# Patient Record
Sex: Male | Born: 1965 | Hispanic: No | Marital: Married | Smoking: Never smoker
Health system: Southern US, Community
[De-identification: ages and names within clinical notes are randomized; demographics above are authoritative.]

## PROBLEM LIST (undated history)

## (undated) DIAGNOSIS — D649 Anemia, unspecified: Secondary | ICD-10-CM

## (undated) DIAGNOSIS — F419 Anxiety disorder, unspecified: Secondary | ICD-10-CM

## (undated) DIAGNOSIS — G629 Polyneuropathy, unspecified: Secondary | ICD-10-CM

## (undated) DIAGNOSIS — IMO0002 Reserved for concepts with insufficient information to code with codable children: Secondary | ICD-10-CM

## (undated) DIAGNOSIS — T7840XA Allergy, unspecified, initial encounter: Secondary | ICD-10-CM

## (undated) HISTORY — DX: Anxiety disorder, unspecified: F41.9

## (undated) HISTORY — DX: Reserved for concepts with insufficient information to code with codable children: IMO0002

## (undated) HISTORY — DX: Polyneuropathy, unspecified: G62.9

## (undated) HISTORY — DX: Anemia, unspecified: D64.9

## (undated) HISTORY — DX: Allergy, unspecified, initial encounter: T78.40XA

---

## 2001-08-19 ENCOUNTER — Emergency Department (HOSPITAL_COMMUNITY): Admission: EM | Admit: 2001-08-19 | Discharge: 2001-08-19 | Payer: Self-pay | Admitting: Emergency Medicine

## 2001-09-01 ENCOUNTER — Emergency Department (HOSPITAL_COMMUNITY): Admission: EM | Admit: 2001-09-01 | Discharge: 2001-09-01 | Payer: Self-pay

## 2011-11-04 ENCOUNTER — Ambulatory Visit (INDEPENDENT_AMBULATORY_CARE_PROVIDER_SITE_OTHER): Payer: 59 | Admitting: Family Medicine

## 2011-11-04 VITALS — BP 166/93 | HR 63 | Temp 98.0°F | Resp 16 | Ht 68.5 in | Wt 176.0 lb

## 2011-11-04 DIAGNOSIS — T169XXA Foreign body in ear, unspecified ear, initial encounter: Secondary | ICD-10-CM

## 2011-11-04 NOTE — Progress Notes (Signed)
  Subjective:    Patient ID: Greg Johnson, male    DOB: 1965/07/11, 46 y.o.   MRN: [id number]  HPI Greg Johnson is a 46 y.o. male Wearing ear plug - foam exterior of plug came apart and is in L ear.  Occurred this am around 8:45.  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  Was using different ear plugs than usual at work.  No prior ear problems.  No pain, just blocked.  No discharge.    Review of Systems As above.     Objective:   Physical Exam  Constitutional: He is oriented to person, place, and time. He appears well-developed and well-nourished.  HENT:  Head: Normocephalic and atraumatic.  Right Ear: Tympanic membrane, external ear and ear canal normal.  Left Ear: Tympanic membrane and ear canal normal.       Yellow foam fb at end of canal.  Removed without difficulty using alligator forceps.  No erythema or abrasion of canal.,  Tm pearly gray without any signs of rupture.   Pulmonary/Chest: Effort normal.  Neurological: He is alert and oriented to person, place, and time.  Skin: Skin is warm and dry.  Psychiatric: He has a normal mood and affect. His behavior is normal.        Assessment & Plan:  Greg Johnson is a 46 y.o. male 1. Acute foreign body of ear canal   L ear - removed without difficulty as above. Advised to change out earplugs and watch for decay in future ones.  rtc if any pain in ear, or residual sx's - understanding expressed.

## 2012-10-14 ENCOUNTER — Encounter (HOSPITAL_COMMUNITY): Payer: Self-pay

## 2012-10-14 ENCOUNTER — Emergency Department (HOSPITAL_COMMUNITY)
Admission: EM | Admit: 2012-10-14 | Discharge: 2012-10-14 | Disposition: A | Discharge status: Home / Self Care | Payer: 59 | Source: Home / Self Care

## 2012-10-14 DIAGNOSIS — S0501XA Injury of conjunctiva and corneal abrasion without foreign body, right eye, initial encounter: Secondary | ICD-10-CM

## 2012-10-14 DIAGNOSIS — S058X9A Other injuries of unspecified eye and orbit, initial encounter: Secondary | ICD-10-CM

## 2012-10-14 MED ORDER — TETRACAINE HCL 0.5 % OP SOLN
2.0000 [drp] | Freq: Once | OPHTHALMIC | Status: AC
  Administered 2012-10-14: 2 [drp] via OPHTHALMIC

## 2012-10-14 MED ORDER — POLYMYXIN B-TRIMETHOPRIM 10000-0.1 UNIT/ML-% OP SOLN: 1.0000 [drp] | OPHTHALMIC | Status: DC

## 2012-10-14 MED ORDER — TETRACAINE HCL 0.5 % OP SOLN
OPHTHALMIC | Status: AC
  Filled 2012-10-14: qty 2

## 2012-10-14 NOTE — ED Provider Notes (Signed)
  CSN: [id number]     Arrival date & time 10/14/12  1425 History     None    Chief Complaint  Patient presents with  . Eye Problem   (Consider location/radiation/quality/duration/timing/severity/associated sxs/prior Treatment) HPI Comments: 47 year old male states he has a foreign body sensation in the right since he was exposed to dust 2 days ago. Denies problems with vision. No pain just feels uncomfortable.   History reviewed. No pertinent past medical history. History reviewed. No pertinent past surgical history. History reviewed. No pertinent family history. History  Substance Use Topics  . Smoking status: Never Smoker   . Smokeless tobacco: Not on file  . Alcohol Use: No    Review of Systems  Constitutional: Negative.   Eyes: Positive for photophobia and redness. Negative for discharge, itching and visual disturbance.  All other systems reviewed and are negative.    Allergies  Aspirin  Home Medications   Current Outpatient Rx  Name  Route  Sig  Dispense  Refill  . trimethoprim-polymyxin b (POLYTRIM) ophthalmic solution   Right Eye   Place 1 drop into the right eye every 4 (four) hours. For 4 days   10 mL   0    BP 123/82  Pulse 72  Temp(Src) 98.2 F (36.8 C) (Oral)  Resp 16  SpO2 97% Physical Exam  Nursing note and vitals reviewed. Constitutional: He is oriented to person, place, and time. He appears well-developed and well-nourished. No distress.  Eyes: EOM are normal. Pupils are equal, round, and reactive to light.  Minor conjunctival and scleral injection. The eye was anesthetized with tetracaine and then used 4 seen stain. Magnification and blue light there was a pinpoint uptake over the cornea at 7:00 in various areas of uptake over the sclera. No hyphema, anterior chambers clear.  Neck: Normal range of motion. Neck supple.  Pulmonary/Chest: Effort normal.  Neurological: He is alert and oriented to person, place, and time. He exhibits normal muscle  tone.  Skin: Skin is warm and dry.    ED Course   Procedures (including critical care time)  Labs Reviewed - No data to display No results found. 1. Corneal abrasion, right, initial encounter     MDM  Rest for the remainder of the day and get a good night sleep. Polytrim eye drops one in the right eye 4 times a day for 4 days Do not rub I. If there is no improvement or any worsening such as pain, swelling, problem with vision call the above ophthalmologist for appointment.    Hayden Rasmussen, NP 10/14/12 1551

## 2012-10-14 NOTE — ED Provider Notes (Signed)
Medical screening examination/treatment/procedure(s) were performed by non-physician practitioner and as supervising physician I was immediately available for consultation/collaboration.  Leslee Home, M.D.   Reuben Likes, MD 10/14/12 [phone]

## 2012-10-14 NOTE — ED Notes (Signed)
States he was in a dusty place 2 days ago, and since then , he has had a FB type sensation both eyes

## 2013-06-02 ENCOUNTER — Ambulatory Visit (INDEPENDENT_AMBULATORY_CARE_PROVIDER_SITE_OTHER): Payer: 59 | Admitting: Family Medicine

## 2013-06-02 VITALS — BP 130/80 | HR 72 | Temp 97.6°F | Resp 16 | Ht 69.0 in | Wt 174.0 lb

## 2013-06-02 DIAGNOSIS — R32 Unspecified urinary incontinence: Secondary | ICD-10-CM

## 2013-06-02 DIAGNOSIS — M79672 Pain in left foot: Principal | ICD-10-CM

## 2013-06-02 DIAGNOSIS — M79671 Pain in right foot: Secondary | ICD-10-CM

## 2013-06-02 DIAGNOSIS — M79609 Pain in unspecified limb: Secondary | ICD-10-CM

## 2013-06-02 DIAGNOSIS — R350 Frequency of micturition: Secondary | ICD-10-CM

## 2013-06-02 LAB — POCT URINALYSIS DIPSTICK
BILIRUBIN UA: NEGATIVE
Blood, UA: NEGATIVE
GLUCOSE UA: NEGATIVE
Ketones, UA: NEGATIVE
LEUKOCYTES UA: NEGATIVE
NITRITE UA: NEGATIVE
Protein, UA: NEGATIVE
Spec Grav, UA: 1.015
UROBILINOGEN UA: 0.2
pH, UA: 6.5

## 2013-06-02 LAB — POCT UA - MICROSCOPIC ONLY
BACTERIA, U MICROSCOPIC: NEGATIVE
CASTS, UR, LPF, POC: NEGATIVE
Crystals, Ur, HPF, POC: NEGATIVE
Epithelial cells, urine per micros: NEGATIVE
MUCUS UA: NEGATIVE
RBC, urine, microscopic: NEGATIVE
WBC, UR, HPF, POC: NEGATIVE
YEAST UA: NEGATIVE

## 2013-06-02 LAB — GLUCOSE, POCT (MANUAL RESULT ENTRY): POC GLUCOSE: 91 mg/dL (ref 70–99)

## 2013-06-02 MED ORDER — MELOXICAM 7.5 MG PO TABS: ORAL_TABLET | ORAL | Status: DC

## 2013-06-02 NOTE — Progress Notes (Signed)
Subjective: Patient is here for several things. He has had problems over the last week with the bottom of his feet hurting him. Thank you this is gone some for several months, but especially over the last week it has been nonstop. When he is standing on it it just hurts clear across the soles of both feet. He is not diabetic. He is not had no injuries. He wears steel toed shoes. He says the pain is relieved when he gets off of them. He wears a double insert in his shoe.  He also has had some urinary incontinence issues a couple of times. He doesn't know why. He has no nocturia. No brain when he urinates. No history of kidney disease.  Objective: No acute distress. No CVA tenderness. Abdomen soft without mass or tenderness. Pulses are present in both feet. Feet are a little flat. No tenderness to palpation. Sensory with the filament was normal.  Assessment: Bilateral foot pain undetermined etiology History of urinary incontinence  Plan: Urinalysis and glucose Results for orders placed in visit on 06/02/13  POCT UA - MICROSCOPIC ONLY      Result Value Ref Range   WBC, Ur, HPF, POC neg     RBC, urine, microscopic neg     Bacteria, U Microscopic neg     Mucus, UA neg     Epithelial cells, urine per micros neg     Crystals, Ur, HPF, POC neg     Casts, Ur, LPF, POC neg     Yeast, UA neg    POCT URINALYSIS DIPSTICK      Result Value Ref Range   Color, UA light yellow     Clarity, UA clear     Glucose, UA neg     Bilirubin, UA neg     Ketones, UA neg     Spec Grav, UA 1.015     Blood, UA neg     pH, UA 6.5     Protein, UA neg     Urobilinogen, UA 0.2     Nitrite, UA neg     Leukocytes, UA Negative    GLUCOSE, POCT (MANUAL RESULT ENTRY)      Result Value Ref Range   POC Glucose 91  70 - 99 mg/dl   I think his foot pain is purely mechanical from being on his feet so much. He has a history of GERD and dyspepsia. However we decided to try a low dose of Mobic once daily and see if he  can tolerate it.  Urinary problems get worse we will refer him to a urologist.

## 2013-06-02 NOTE — Patient Instructions (Signed)
Your urine test is good. If you keep having problems with difficulty controlling urine we will consider referring him to a urologist  Not sure why you're having foot pain. I am going to try some meloxicam 7.5 mg once daily. Take it with your main meal. Drink some extra water when you take it. If it causes stomach pain please stop taking it. If it does well but you continue to have foot pain, you can try increasing it to 1 pill 2 times a day with food. If at any time it is causing her stomach to be worse please discontinue it.  Consider getting new inserts for your shoes. Old ones do lose their adding and don't work as well.

## 2013-07-10 ENCOUNTER — Telehealth: Payer: Self-pay

## 2013-07-10 NOTE — Telephone Encounter (Signed)
Recommend pt RTC to discuss other options/medications

## 2013-07-10 NOTE — Telephone Encounter (Signed)
Pt called. Says his feet are not any better. They are still inflamed. Wants to know what else he can do. Thanks

## 2013-07-11 NOTE — Telephone Encounter (Signed)
Left message on machine with instructions to return, call back if questions or concerns.

## 2013-09-04 ENCOUNTER — Telehealth: Payer: Self-pay

## 2013-09-04 NOTE — Telephone Encounter (Signed)
meloxicam (MOBIC) 7.5 MG tablet  [facility] [address]   Patient is difficult to understand, he is requesting a refill on mobic. States he called four times and no one responded.  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

## 2013-09-05 NOTE — Telephone Encounter (Signed)
Lm pt needs to RTC

## 2013-09-06 ENCOUNTER — Ambulatory Visit (INDEPENDENT_AMBULATORY_CARE_PROVIDER_SITE_OTHER): Payer: 59 | Admitting: Family Medicine

## 2013-09-06 VITALS — BP 126/80 | HR 69 | Temp 97.2°F | Resp 14 | Ht 69.0 in | Wt 169.0 lb

## 2013-09-06 DIAGNOSIS — M722 Plantar fascial fibromatosis: Secondary | ICD-10-CM

## 2013-09-06 MED ORDER — PREDNISONE 20 MG PO TABS: 20.0000 mg | ORAL_TABLET | Freq: Every day | ORAL | Status: DC

## 2013-09-06 NOTE — Patient Instructions (Signed)
Plantar Fasciitis  Plantar fasciitis is a common condition that causes foot pain. It is soreness (inflammation) of the band of tough fibrous tissue on the bottom of the foot that runs from the heel bone (calcaneus) to the ball of the foot. The cause of this soreness may be from excessive standing, poor fitting shoes, running on hard surfaces, being overweight, having an abnormal walk, or overuse (this is common in runners) of the painful foot or feet. It is also common in aerobic exercise dancers and ballet dancers.  SYMPTOMS   Most people with plantar fasciitis complain of:   Severe pain in the morning on the bottom of their foot especially when taking the first steps out of bed. This pain recedes after a few minutes of walking.   Severe pain is experienced also during walking following a long period of inactivity.   Pain is worse when walking barefoot or up stairs  DIAGNOSIS    Your caregiver will diagnose this condition by examining and feeling your foot.   Special tests such as X-rays of your foot, are usually not needed.  PREVENTION    Consult a sports medicine professional before beginning a new exercise program.   Walking programs offer a good workout. With walking there is a lower chance of overuse injuries common to runners. There is less impact and less jarring of the joints.   Begin all new exercise programs slowly. If problems or pain develop, decrease the amount of time or distance until you are at a comfortable level.   Wear good shoes and replace them regularly.   Stretch your foot and the heel cords at the back of the ankle (Achilles tendon) both before and after exercise.   Run or exercise on even surfaces that are not hard. For example, asphalt is better than pavement.   Do not run barefoot on hard surfaces.   If using a treadmill, vary the incline.   Do not continue to workout if you have foot or joint problems. Seek professional help if they do not improve.  HOME CARE INSTRUCTIONS     Avoid activities that cause you pain until you recover.   Use ice or cold packs on the problem or painful areas after working out.   Only take over-the-counter or prescription medicines for pain, discomfort, or fever as directed by your caregiver.   Soft shoe inserts or athletic shoes with air or gel sole cushions may be helpful.   If problems continue or become more severe, consult a sports medicine caregiver or your own health care provider. Cortisone is a potent anti-inflammatory medication that may be injected into the painful area. You can discuss this treatment with your caregiver.  MAKE SURE YOU:    Understand these instructions.   Will watch your condition.   Will get help right away if you are not doing well or get worse.  Document Released: 11/04/2000 Document Revised: 05/04/2011 Document Reviewed: 01/04/2008  ExitCare Patient Information 2015 [employer]. This information is not intended to replace advice given to you by your health care provider. Make sure you discuss any questions you have with your health care provider.

## 2013-09-06 NOTE — Progress Notes (Signed)
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  He has been having sore feet for several months, bilateral and symmetric.  No pain with movement, no deformity, no swelling, no trauma  He was seen several months ago and started on Mobic which did nothing for the soreness.  He also tried inserts into his steel toed shoes which has not helped either  Objective:  NAD Both feet appear normal in bony and soft tissue configurations.  There is mild tenderness particularly over the heels. Good pedal pulses, no rashes.  Assessment:  Mild plantar fasciitis.  Plantar fasciitis, bilateral - Plan: predniSONE (DELTASONE) 20 MG tablet  Signed, Elvina SidleKurt Taylour Lietzke, MD

## 2014-04-14 ENCOUNTER — Other Ambulatory Visit: Payer: Self-pay | Admitting: Family Medicine

## 2014-04-14 ENCOUNTER — Ambulatory Visit (INDEPENDENT_AMBULATORY_CARE_PROVIDER_SITE_OTHER): Payer: 59 | Admitting: Emergency Medicine

## 2014-04-14 VITALS — BP 140/88 | HR 75 | Temp 97.9°F | Resp 18 | Ht 69.0 in | Wt 172.0 lb

## 2014-04-14 DIAGNOSIS — G5791 Unspecified mononeuropathy of right lower limb: Secondary | ICD-10-CM

## 2014-04-14 DIAGNOSIS — R208 Other disturbances of skin sensation: Secondary | ICD-10-CM

## 2014-04-14 DIAGNOSIS — M79671 Pain in right foot: Secondary | ICD-10-CM

## 2014-04-14 DIAGNOSIS — G5793 Unspecified mononeuropathy of bilateral lower limbs: Secondary | ICD-10-CM

## 2014-04-14 DIAGNOSIS — E041 Nontoxic single thyroid nodule: Secondary | ICD-10-CM

## 2014-04-14 DIAGNOSIS — G5792 Unspecified mononeuropathy of left lower limb: Secondary | ICD-10-CM

## 2014-04-14 DIAGNOSIS — M79672 Pain in left foot: Principal | ICD-10-CM

## 2014-04-14 DIAGNOSIS — R200 Anesthesia of skin: Secondary | ICD-10-CM

## 2014-04-14 DIAGNOSIS — Z0000 Encounter for general adult medical examination without abnormal findings: Secondary | ICD-10-CM

## 2014-04-14 DIAGNOSIS — Z23 Encounter for immunization: Secondary | ICD-10-CM

## 2014-04-14 LAB — LIPID PANEL
CHOLESTEROL: 137 mg/dL (ref 0–200)
HDL: 46 mg/dL (ref >39)
LDL CALC: 80 mg/dL (ref 0–99)
Total CHOL/HDL Ratio: 3 Ratio
Triglycerides: 55 mg/dL (ref <150)
VLDL: 11 mg/dL (ref 0–40)

## 2014-04-14 LAB — POCT UA - MICROSCOPIC ONLY
CRYSTALS, UR, HPF, POC: NEGATIVE
Casts, Ur, LPF, POC: NEGATIVE
Mucus, UA: NEGATIVE
RBC, urine, microscopic: NEGATIVE
YEAST UA: NEGATIVE

## 2014-04-14 LAB — CBC
HEMATOCRIT: 43.3 % (ref 39.0–52.0)
Hemoglobin: 14.6 g/dL (ref 13.0–17.0)
MCH: 29.6 pg (ref 26.0–34.0)
MCHC: 33.7 g/dL (ref 30.0–36.0)
MCV: 87.8 fL (ref 78.0–100.0)
MPV: 10.3 fL (ref 8.6–12.4)
PLATELETS: 305 10*3/uL (ref 150–400)
RBC: 4.93 MIL/uL (ref 4.22–5.81)
RDW: 14.3 % (ref 11.5–15.5)
WBC: 4.3 10*3/uL (ref 4.0–10.5)

## 2014-04-14 LAB — RPR

## 2014-04-14 LAB — COMPLETE METABOLIC PANEL WITH GFR
ALBUMIN: 4.4 g/dL (ref 3.5–5.2)
ALK PHOS: 53 U/L (ref 39–117)
ALT: 21 U/L (ref 0–53)
AST: 19 U/L (ref 0–37)
BUN: 14 mg/dL (ref 6–23)
CO2: 29 meq/L (ref 19–32)
CREATININE: 1.01 mg/dL (ref 0.50–1.35)
Calcium: 9.2 mg/dL (ref 8.4–10.5)
Chloride: 103 mEq/L (ref 96–112)
GFR, Est African American: >89 mL/min
GFR, Est Non African American: 87 mL/min
GLUCOSE: 100 mg/dL — AB (ref 70–99)
Potassium: 4.6 mEq/L (ref 3.5–5.3)
SODIUM: 139 meq/L (ref 135–145)
Total Bilirubin: 0.8 mg/dL (ref 0.2–1.2)
Total Protein: 7.1 g/dL (ref 6.0–8.3)

## 2014-04-14 LAB — POCT URINALYSIS DIPSTICK
Bilirubin, UA: NEGATIVE
Blood, UA: NEGATIVE
GLUCOSE UA: NEGATIVE
Ketones, UA: NEGATIVE
LEUKOCYTES UA: NEGATIVE
NITRITE UA: NEGATIVE
PROTEIN UA: NEGATIVE
SPEC GRAV UA: 1.02
Urobilinogen, UA: 0.2
pH, UA: 7

## 2014-04-14 LAB — VITAMIN B12: Vitamin B-12: 682 pg/mL (ref 211–911)

## 2014-04-14 LAB — IFOBT (OCCULT BLOOD): IFOBT: NEGATIVE

## 2014-04-14 LAB — TSH: TSH: 1.539 u[IU]/mL (ref 0.350–4.500)

## 2014-04-14 LAB — HIV ANTIBODY (ROUTINE TESTING W REFLEX): HIV: NONREACTIVE

## 2014-04-14 MED ORDER — TRIAMCINOLONE 0.1 % CREAM:EUCERIN CREAM 1:1: 1.0000 "application " | TOPICAL_CREAM | Freq: Three times a day (TID) | CUTANEOUS | Status: DC

## 2014-04-14 NOTE — Addendum Note (Signed)
Addended by: Eddie CandleLUCK, Nitin Mckowen L on: 04/14/2014 11:07 AM   Modules accepted: Orders

## 2014-04-14 NOTE — Addendum Note (Signed)
Addended by: Eddie CandleLUCK, Oshea Percival L on: 04/14/2014 11:00 AM   Modules accepted: Orders

## 2014-04-14 NOTE — Patient Instructions (Signed)

## 2014-04-14 NOTE — Progress Notes (Addendum)
Subjective:  This chart was scribed for Earl Lites, MD by Haywood Pao, ED Scribe at [facility].The patient was seen in exam room 11 and the patient's care was started at 8:45 AM.   Patient ID: Greg Johnson, male    DOB: 1965/08/24, 49 y.o.   MRN: [id number] Chief Complaint  Patient presents with  . Annual Exam    pt NPO,  last PE over 1 year ago  . Foot Pain    c/o burning - has been checked here previously   HPI HPI Comments: Greg Johnson is a 49 y.o. male who presents to [facility] for an annual physical exam. His last physical exam was last year. He is also complaining of bilateral foot pain. He was seen last year for the same complaint and diagnosed with plantar fasciitis. He was treated with prednisone which did not improve his symptoms. Pt works as an Hotel manager, standing for long hours. He is originally from [place], he went back home in September and had all his immunizations prior to leaving.  Past Medical History  Diagnosis Date  . Allergy   . Anemia   . Anxiety   . Ulcer    Allergies  Allergen Reactions  . Aspirin Nausea And Vomiting   Prior to Admission medications   Not on File   Review of Systems  Constitutional: Positive for diaphoresis.  Musculoskeletal: Positive for back pain.  Neurological: Positive for light-headedness.  All other systems reviewed and are negative.     Objective:  Physical Exam  Constitutional: He is oriented to person, place, and time. He appears well-developed and well-nourished. No distress.  HENT:  Head: Normocephalic and atraumatic.  Eyes: Pupils are equal, round, and reactive to light.  Neck: Normal range of motion.  prominence of the right lobe of the thyroid.  Cardiovascular: Normal rate and regular rhythm.   Pulmonary/Chest: Effort normal. No respiratory distress.  Musculoskeletal: Normal range of motion.  Neurological: He is alert and oriented to person, place, and time.  Decreased reflex of both  legs but normal strength.  Skin: Skin is warm and dry.  Scarring of his fast and left anterior chest.  Psychiatric: He has a normal mood and affect. His behavior is normal.  Nursing note and vitals reviewed. BP 140/88 mmHg  Pulse 75  Temp(Src) 97.9 F (36.6 C) (Oral)  Resp 18  Ht  (1.753 m)  Wt 172 lb (78.019 kg)  BMI 25.39 kg/m2  SpO2 100% Results for orders placed or performed in visit on 04/14/14  POCT urinalysis dipstick  Result Value Ref Range   Color, UA yellow    Clarity, UA clear    Glucose, UA neg    Bilirubin, UA neg    Ketones, UA neg    Spec Grav, UA 1.020    Blood, UA neg    pH, UA 7.0    Protein, UA neg    Urobilinogen, UA 0.2    Nitrite, UA neg    Leukocytes, UA Negative   POCT UA - Microscopic Only  Result Value Ref Range   WBC, Ur, HPF, POC 0-1    RBC, urine, microscopic neg    Bacteria, U Microscopic trace    Mucus, UA neg    Epithelial cells, urine per micros 0-1    Crystals, Ur, HPF, POC neg    Casts, Ur, LPF, POC neg    Yeast, UA neg    hemosure negative    Assessment &  Plan:  Patient has a peripheral neuropathy. Routine tests done for this. He has a thyroid nodule on exam. All labs were done. We'll proceed with a thyroid ultrasound. T gap given today. He does have some decreased sensation in his feet I wonder if he does have some type of neuropathy. I did check a B12 level and a HIV test.I personally performed the services described in this documentation, which was scribed in my presence. The recorded information has been reviewed and is accurate.

## 2014-04-16 ENCOUNTER — Telehealth: Payer: Self-pay | Admitting: *Deleted

## 2014-04-16 LAB — PSA: PSA: 2.33 ng/mL (ref <=4.00)

## 2014-04-16 NOTE — Telephone Encounter (Signed)
Pt called stating someone called and left an message.  However, there was no telephone message in epic.  Pt states for us to call him tomorrow please.

## 2014-04-16 NOTE — Telephone Encounter (Signed)
Dr Cleta Albertsaub, do you want to OK RFs on mobic Rxd for pt in the past for his foot pain?

## 2014-04-17 MED ORDER — MELOXICAM 7.5 MG PO TABS: ORAL_TABLET | ORAL | Status: DC

## 2014-04-17 NOTE — Telephone Encounter (Addendum)
Pt CB and reported that he never has had any trouble w/his stomach when taking meloxicam, only w/aspirin. I advised that I will send in a RF, but that if he continues to have problems he should RTC to see if he needs to see a specialist or other treatment. Advised pt to take meloxicam w/food. Pt agreed.

## 2014-04-17 NOTE — Telephone Encounter (Signed)
Call patient and be sure he does not have any nausea or vomiting or GI upset when he takes the meloxicam. He has a history of sensitivity to aspirin. If he tolerates the meloxicam okay then go ahead and call in a prescription.

## 2014-04-17 NOTE — Telephone Encounter (Signed)
Called cell # in EPIC and father answered and gave me pt's number [phone]. I called and LMOM for pt to CB. I also LM at the H # listed in EPIC, but not sure if it is correct.

## 2014-04-17 NOTE — Addendum Note (Signed)
Addended by: Sheppard PlumberBRIGGS, Anessia Oakland A on: 04/17/2014 12:28 PM   Modules accepted: Orders

## 2014-05-04 ENCOUNTER — Ambulatory Visit
Admission: RE | Admit: 2014-05-04 | Discharge: 2014-05-04 | Disposition: A | Discharge status: Home / Self Care | Payer: 59 | Source: Ambulatory Visit | Attending: Emergency Medicine | Admitting: Emergency Medicine

## 2014-05-04 DIAGNOSIS — E041 Nontoxic single thyroid nodule: Secondary | ICD-10-CM

## 2014-05-04 DIAGNOSIS — Z0000 Encounter for general adult medical examination without abnormal findings: Secondary | ICD-10-CM

## 2014-05-04 NOTE — Telephone Encounter (Signed)
Patient states the Mobic is not working. He would like a different medication. He says the bottom of his foot has a burning sensation.

## 2014-05-05 ENCOUNTER — Other Ambulatory Visit: Payer: Self-pay | Admitting: Family Medicine

## 2014-05-05 ENCOUNTER — Other Ambulatory Visit: Payer: Self-pay | Admitting: Emergency Medicine

## 2014-05-05 DIAGNOSIS — E042 Nontoxic multinodular goiter: Secondary | ICD-10-CM

## 2014-05-05 MED ORDER — GABAPENTIN 300 MG PO CAPS: ORAL_CAPSULE | ORAL | Status: DC

## 2014-06-06 ENCOUNTER — Ambulatory Visit (INDEPENDENT_AMBULATORY_CARE_PROVIDER_SITE_OTHER): Payer: 59 | Admitting: Physician Assistant

## 2014-06-06 VITALS — BP 124/82 | HR 85 | Temp 97.6°F | Resp 18 | Ht 69.0 in | Wt 169.0 lb

## 2014-06-06 DIAGNOSIS — J309 Allergic rhinitis, unspecified: Secondary | ICD-10-CM

## 2014-06-06 DIAGNOSIS — R0981 Nasal congestion: Secondary | ICD-10-CM | POA: Diagnosis not present

## 2014-06-06 DIAGNOSIS — R52 Pain, unspecified: Secondary | ICD-10-CM

## 2014-06-06 DIAGNOSIS — R059 Cough, unspecified: Secondary | ICD-10-CM

## 2014-06-06 DIAGNOSIS — R05 Cough: Secondary | ICD-10-CM

## 2014-06-06 LAB — POCT INFLUENZA A/B
INFLUENZA A, POC: NEGATIVE
Influenza B, POC: NEGATIVE

## 2014-06-06 MED ORDER — HYDROCODONE-HOMATROPINE 5-1.5 MG/5ML PO SYRP: 5.0000 mL | ORAL_SOLUTION | Freq: Three times a day (TID) | ORAL | Status: DC | PRN

## 2014-06-06 NOTE — Patient Instructions (Addendum)
Your flu swab was negative today.  For your allergies, please take zyrtec every day and use a steroid nasal spray like flonase every day for the rest of allergy season. This should help with your congestion. Taking a daily decongestant like sudafed or zyrtec-d may also help.  For the cough, please take the hycodan up to every 8 hours as needed for cough. This may make you sleepy. Getting plenty of rest, drinking lots of fluids may help.  Please come back to see us if you're not feeling better in 4-5 days.

## 2014-06-06 NOTE — Progress Notes (Signed)
Subjective:    Patient ID: Greg Johnson, male    DOB: [birth year], 49 y.o.   MRN: [id number]  Chief Complaint  Patient presents with  . Fever    x3 weeks but worse now   . Headache  . Cough    some mucous in the morning   . Nasal Congestion  . Fatigue  . Generalized Body Aches   Patient Active Problem List   Diagnosis Date Noted  . Neuropathy of both feet 04/14/2014  . Thyroid nodule 04/14/2014   Prior to Admission medications   Medication Sig Start Date End Date Taking? Authorizing Provider  Triamcinolone Acetonide (TRIAMCINOLONE 0.1 % CREAM : EUCERIN) CREA Apply 1 application topically 3 (three) times daily. Patient not taking: Reported on 06/06/2014 04/14/14   Collene Gobble, MD   Medications, allergies, past medical history, surgical history, family history, social history and problem list reviewed and updated.  HPI  60 yom with no pertinent pmh presents with above sx.   Sx have been intermittent for past few wks. Subjective fevers, head/nasal congestion, generalized body aches and mildly prod cough. Sx last for few days at time then resolve for few days. Taking dayquil and nyquil with some relief. Feels that sx came on again yest with subj fevers and body aches. Pt has not checked temp at home.   Denies cp, sob, st, abd pain, n/v, diarrhea. Vitals reassuring today, no antipyretics this am.   Gets these sx every year this time of year. Admits to seasonal allergies. Takes zyrtec rarely.   Review of Systems See HPI.     Objective:   Physical Exam  Constitutional: He appears well-developed and well-nourished.  Non-toxic appearance. He does not have a sickly appearance. He does not appear ill. No distress.  BP 124/82 mmHg  Pulse 85  Temp(Src) 97.6 F (36.4 C) (Oral)  Resp 18  Ht  (1.753 m)  Wt 169 lb (76.658 kg)  BMI 24.95 kg/m2  SpO2 98%   HENT:  Right Ear: Tympanic membrane normal.  Left Ear: Tympanic membrane normal.  Nose: Mucosal edema and rhinorrhea  present. Right sinus exhibits no maxillary sinus tenderness and no frontal sinus tenderness. Left sinus exhibits no maxillary sinus tenderness and no frontal sinus tenderness.  Mouth/Throat: Uvula is midline, oropharynx is clear and moist and mucous membranes are normal. No oropharyngeal exudate, posterior oropharyngeal edema, posterior oropharyngeal erythema or tonsillar abscesses.  Pulmonary/Chest: Effort normal and breath sounds normal. He has no decreased breath sounds. He has no wheezes. He has no rhonchi. He has no rales.  Lymphadenopathy:       Head (right side): No submental, no submandibular and no tonsillar adenopathy present.       Head (left side): No submental, no submandibular and no tonsillar adenopathy present.    He has no cervical adenopathy.   Results for orders placed or performed in visit on 06/06/14  POCT Influenza A/B  Result Value Ref Range   Influenza A, POC Negative    Influenza B, POC Negative       Assessment & Plan:   81 yom with no pertinent pmh presents with above sx.   Cough - Plan: POCT Influenza A/B Body aches - Plan: POCT Influenza A/B Head congestion - Plan: POCT Influenza A/B Allergic rhinitis, unspecified allergic rhinitis type --flu swab negative --vitals normal, physical exam not suggestive of bacterial etiology --sx likely due to allergic rhinitis or viral uri --> zyrtec-d, flonase, hycodan, rest, fluids, tylenol prn --rtc  4-5 days if not improving  Donnajean Lopesodd M. Kynzleigh Bandel, PA-C Physician Assistant-Certified [facility]  06/06/2014 1:06 PM

## 2014-10-19 ENCOUNTER — Ambulatory Visit (INDEPENDENT_AMBULATORY_CARE_PROVIDER_SITE_OTHER): Payer: Commercial Managed Care - PPO | Admitting: Physician Assistant

## 2014-10-19 VITALS — BP 150/90 | HR 86 | Temp 98.0°F | Ht 68.5 in | Wt 177.0 lb

## 2014-10-19 DIAGNOSIS — L239 Allergic contact dermatitis, unspecified cause: Secondary | ICD-10-CM

## 2014-10-19 DIAGNOSIS — L200 Besnier's prurigo: Secondary | ICD-10-CM

## 2014-10-19 DIAGNOSIS — R21 Rash and other nonspecific skin eruption: Secondary | ICD-10-CM

## 2014-10-19 MED ORDER — TRIAMCINOLONE ACETONIDE 0.1 % EX CREA: 1.0000 "application " | TOPICAL_CREAM | Freq: Two times a day (BID) | CUTANEOUS | Status: DC

## 2014-10-19 NOTE — Patient Instructions (Signed)
As I'm unsure what's causing your irritation at this time I've referred you to an allergist to determine the cause.  We'll let you know when this appointment is scheduled.  Please apply the steroid topical to the irritated areas twice daily. Please apply eucerin cream 1-2 times daily over your body, especially after showering and bathing.  Taking a benadryl at night can help with the itching to help you sleep.

## 2014-10-19 NOTE — Progress Notes (Signed)
   Subjective:    Patient ID: Greg Johnson, male    DOB: September 04, 1965, 49 y.o.   MRN: [id number]  Chief Complaint  Patient presents with  . Medication Problem    pt states since 05/2014 visit has had "red lines" all over his body - given Zyrtec & Tusinex for cough-he feels allergic to meds  . Rash   Medications, allergies, past medical history, surgical history, family history, social history and problem list reviewed and updated.  HPI  48 yom presents to clinic worried about allergic rxn.   Has felt itching all over body intermittently for past 2 months, mostly bilateral arms and some facial involvement. Approx 1-2 x week. Cannot think of any aggravating activities other than occasionally hot showers are irritating. Denies any obvious rash. Only new medication was cough medicine and zyrtec which he took 4 months for about one week. Otherwise no new exposures including lotions, detergents, bedding, pets, foods, meds. Did start new job few months ago where he is possibly exposed to new chemicals though he is not specifically aware of any.   Denies fevers, chills. Nobody else in household experiencing this. Denies trouble breathing or swallowing. He is concerned he is allergic to the meds he took 4 months ago.    Review of Systems No cp, sob, abd pain, n/v, diarrhea.     Objective:   Physical Exam  Constitutional: He is oriented to person, place, and time. He appears well-developed and well-nourished.  Non-toxic appearance. He does not have a sickly appearance. He does not appear ill. No distress.  BP 150/90 mmHg  Pulse 86  Temp(Src) 98 F (36.7 C) (Oral)  Ht 5' 8.5" (1.74 m)  Wt 177 lb (80.287 kg)  BMI 26.52 kg/m2  SpO2 98%   HENT:  Mouth/Throat: Uvula is midline, oropharynx is clear and moist and mucous membranes are normal.  Pulmonary/Chest: Effort normal and breath sounds normal.  Neurological: He is alert and oriented to person, place, and time.  Skin:  No obvious rash,  irritation across bilateral arms from recent scratching. No signs infx. Diffuse mildly dry skin with some flaking.       Assessment & Plan:   Rash and nonspecific skin eruption  Allergic dermatitis - Plan: triamcinolone cream (KENALOG) 0.1 %, Ambulatory referral to Allergy --unsure of etiology, doubt medications from 4 months ago are cause --triamcinolone topical bid, eucerin bid especially after showering as dry skin could be cause of pruritis --benadryl qhs prn  --referral to allergy as pt very concerned about possible allergens --rtc if no relief with these measures for more broad pruritis workup  Donnajean Lopes, PA-C Physician Assistant-Certified [facility]  10/20/2014 10:09 PM

## 2014-10-20 ENCOUNTER — Encounter: Payer: Self-pay | Admitting: Physician Assistant

## 2014-12-22 ENCOUNTER — Ambulatory Visit (INDEPENDENT_AMBULATORY_CARE_PROVIDER_SITE_OTHER): Payer: Commercial Managed Care - PPO | Admitting: Physician Assistant

## 2014-12-22 VITALS — BP 130/70 | HR 79 | Temp 97.6°F | Resp 18 | Ht 68.5 in | Wt 177.0 lb

## 2014-12-22 DIAGNOSIS — M545 Low back pain, unspecified: Secondary | ICD-10-CM

## 2014-12-22 DIAGNOSIS — M79672 Pain in left foot: Secondary | ICD-10-CM | POA: Diagnosis not present

## 2014-12-22 DIAGNOSIS — M79671 Pain in right foot: Secondary | ICD-10-CM | POA: Diagnosis not present

## 2014-12-22 MED ORDER — CYCLOBENZAPRINE HCL 5 MG PO TABS: 5.0000 mg | ORAL_TABLET | Freq: Three times a day (TID) | ORAL | Status: DC | PRN

## 2014-12-22 NOTE — Progress Notes (Signed)
   Subjective:    Patient ID: Greg Johnson, male    DOB: 07/01/1965, 49 y.o.   MRN: [id number]  Chief Complaint  Patient presents with  . Back Pain  . Foot Pain    bilateral   Medications, allergies, past medical history, surgical history, family history, social history and problem list reviewed and updated.  HPI  89 yom presents with above complaints.   Bilateral foot pain - we have seen him for this multiple times in clinic. His pain is along with soles of both feet. Fairly constant throughout the day, has been present for more than one year. The pain is actually a burning sensation. No similar sensation over dorsum of feet or up legs. 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 The pain is not worst when first stepping out of bed in morning. Denies fevers or chills. From our clinic we have tried mobic, prednisone, and shoe implants without relief. He has purchased several pair of new shoes without relief.   Back pain - Also chronic. Bilateral low back pain. No trauma. Achy throughout the day. No relieving factors. Denies bowel or bladder dysfunction. Denies fevers. No sciatica or radiation of pain.   Review of Systems See HPI     Objective:   Physical Exam  Constitutional: He is oriented to person, place, and time. He appears well-developed and well-nourished.  Non-toxic appearance. He does not have a sickly appearance. He does not appear ill. No distress.  BP 130/70 mmHg  Pulse 79  Temp(Src) 97.6 F (36.4 C) (Oral)  Resp 18  Ht 5' 8.5" (1.74 m)  Wt 177 lb (80.287 kg)  BMI 26.52 kg/m2  SpO2 99%   Musculoskeletal:       Cervical back: Normal.       Thoracic back: Normal.       Lumbar back: He exhibits tenderness and spasm. He exhibits normal range of motion, no bony tenderness and no deformity.       Back:       Right foot: There is normal range of motion, no tenderness and no bony tenderness.       Left foot: There is normal range of  motion, no tenderness and no bony tenderness.  Bilateral spasms along lumbar paraspinals. Negative straight leg raise bilaterally.   Feet - Negative squeeze test bilaterally. No point tenderness with toe extension and plantar surface palpation. Normal range of motion without issue.   Neurological: He is alert and oriented to person, place, and time.  Psychiatric: He has a normal mood and affect. His speech is normal and behavior is normal.      Assessment & Plan:   Bilateral foot pain - Plan: Ambulatory referral to Podiatry --as we have treated with several different measures we will refer to podiatry at this time --doubt plantar fascitis as previous measures have not worked, pain is not worst 1st thing in morning and he is not point tender on exam --consider Neurontin for nerve type pain pending podiatry recommendation  Bilateral low back pain without sciatica - Plan: cyclobenzaprine (FLEXERIL) 5 MG tablet --no spinal ttp --normal range of motion, normal exam other than ttp over bilateral paraspinal spasms --heat, light range of motion, flexeril  Donnajean Lopesodd M. Jailen Lung, PA-C Physician Assistant-Certified [facility]  12/22/2014 2:23 PM

## 2014-12-22 NOTE — Patient Instructions (Signed)
For your bilateral foot pain I've referred you to the local podiatry group.  I think this is the best option to get their input before we start a nerve pain medication.  We'll be in touch with you when this appointment is scheduled.  For the low back pain, you have small spasms along your spinal muscles.  Applying heat to the area along with light massage will help.  Taking the flexeril every 8 hours as needed will help to ease the spasm. Keep in mind this medicine can make you drowsy so please use with caution.

## 2015-01-01 ENCOUNTER — Other Ambulatory Visit: Payer: Self-pay | Admitting: Physician Assistant

## 2015-01-07 ENCOUNTER — Encounter: Payer: Self-pay | Admitting: Podiatry

## 2015-01-07 ENCOUNTER — Ambulatory Visit (INDEPENDENT_AMBULATORY_CARE_PROVIDER_SITE_OTHER): Payer: Commercial Managed Care - PPO | Admitting: Podiatry

## 2015-01-07 ENCOUNTER — Ambulatory Visit (INDEPENDENT_AMBULATORY_CARE_PROVIDER_SITE_OTHER): Payer: Commercial Managed Care - PPO

## 2015-01-07 VITALS — BP 145/93 | HR 77 | Resp 16 | Ht 68.5 in | Wt 177.0 lb

## 2015-01-07 DIAGNOSIS — M79673 Pain in unspecified foot: Secondary | ICD-10-CM

## 2015-01-07 DIAGNOSIS — M779 Enthesopathy, unspecified: Secondary | ICD-10-CM

## 2015-01-07 DIAGNOSIS — G629 Polyneuropathy, unspecified: Secondary | ICD-10-CM | POA: Diagnosis not present

## 2015-01-07 MED ORDER — DICLOFENAC SODIUM 75 MG PO TBEC: 75.0000 mg | DELAYED_RELEASE_TABLET | Freq: Two times a day (BID) | ORAL | Status: DC

## 2015-01-07 MED ORDER — GABAPENTIN 400 MG PO CAPS: 400.0000 mg | ORAL_CAPSULE | Freq: Three times a day (TID) | ORAL | Status: DC

## 2015-01-07 NOTE — Progress Notes (Signed)
Subjective:     Patient ID: Greg Johnson, male   DOB: 03/27/1965, 49 y.o.   MRN: [id number]  HPI patient presents stating I been getting burning in both my feet that I wanted to get checked and it's been present for about a year and a half and I've seen other physicians and I was on medicine but I did not take it as I should have.   Review of Systems  All other systems reviewed and are negative.      Objective:   Physical Exam  Constitutional: He is oriented to person, place, and time.  Cardiovascular: Intact distal pulses.   Musculoskeletal: Normal range of motion.  Neurological: He is oriented to person, place, and time.  Skin: Skin is warm and dry.  Nursing note and vitals reviewed.  neuro status was intact with both sharp dull and vibratory and negative Tinel's sign noted. Patient has good vascular supply DP and PT pulses and moderate flatfoot deformity noted bilateral. Patient has good digital perfusion is well oriented 3 and has diffuse discomfort but no specific area that's painful when palpated     Assessment:     Appears to be him form of neuropathic like condition or also possibly related to foot structure    Plan:     H&P conditions reviewed and at this time I reviewed medication. We are going to place him on gabapentin 400 mg that he will take 3 times a day whether he has numbness or not or whether it seems to be helping or not. I also placed him on Voltaren 75 mg twice a day and dispensed ankle brace is to lift the arch is up

## 2015-01-07 NOTE — Progress Notes (Signed)
   Subjective:    Patient ID: Greg Johnson, male    DOB: 05/12/1965, 49 y.o.   MRN: [id number]  HPI Patient presents with bilateral foot pain, bottom of feet. Pt stated, "burning sensation on entire bottom of both feet". This has been going on for the past 1.5 years.   Review of Systems  Constitutional: Positive for fatigue.  All other systems reviewed and are negative.      Objective:   Physical Exam        Assessment & Plan:

## 2015-01-29 ENCOUNTER — Other Ambulatory Visit: Payer: Self-pay | Admitting: Podiatry

## 2015-01-29 NOTE — Telephone Encounter (Signed)
Pt needs an appt prior to refills. 

## 2015-02-06 ENCOUNTER — Encounter: Payer: Self-pay | Admitting: Podiatry

## 2015-02-06 ENCOUNTER — Ambulatory Visit (INDEPENDENT_AMBULATORY_CARE_PROVIDER_SITE_OTHER): Payer: Commercial Managed Care - PPO | Admitting: Podiatry

## 2015-02-06 VITALS — BP 131/80 | HR 73 | Resp 16

## 2015-02-06 DIAGNOSIS — G629 Polyneuropathy, unspecified: Secondary | ICD-10-CM

## 2015-02-06 DIAGNOSIS — M79673 Pain in unspecified foot: Secondary | ICD-10-CM

## 2015-02-08 ENCOUNTER — Ambulatory Visit: Payer: Commercial Managed Care - PPO | Admitting: Podiatry

## 2015-02-08 NOTE — Progress Notes (Signed)
Subjective:     Patient ID: Greg Johnson, male   DOB: 03/02/1965, 49 y.o.   MRN: [id number]  HPI patient states he still having pain and it doesn't seem the medicine has helped   Review of Systems     Objective:   Physical Exam No change neurovascular status with moderate numbing-like sensations bilateral with occasional shooting and tingling pain    Assessment:     Probable neuropathy-like symptomatology    Plan:     Going to start this patient on oral vitamin therapy and continuation of gabapentin and will have to see a neurologist if symptoms were to progress

## 2015-07-10 ENCOUNTER — Telehealth: Payer: Self-pay | Admitting: Emergency Medicine

## 2015-07-10 NOTE — Telephone Encounter (Signed)
Please call patient and see if he followed up on his thyroid nodule. If he did not we can reschedule him for evaluation.

## 2015-07-10 NOTE — Telephone Encounter (Signed)
Spoke with pt, he states he will come in Saturday to discuss. Communication barrier.

## 2015-07-10 NOTE — Telephone Encounter (Signed)
Left message for pt to call back  °

## 2015-07-12 ENCOUNTER — Telehealth: Payer: Self-pay

## 2015-07-12 DIAGNOSIS — E041 Nontoxic single thyroid nodule: Secondary | ICD-10-CM

## 2015-07-12 NOTE — Telephone Encounter (Signed)
Dr. Cleta Albertsaub, I do not see this?

## 2015-07-12 NOTE — Telephone Encounter (Signed)
Pt says he did go for thyroid check last year and if he needs a new one this year please go ahead schedule this.  [phone] - see previous message

## 2015-07-13 NOTE — Telephone Encounter (Signed)
He may have the x-ray done the lung. Please go ahead and schedule a repeat thyroid ultrasound to evaluate thyromegaly and nodule

## 2015-07-15 NOTE — Telephone Encounter (Signed)
Referral placed.

## 2015-07-19 ENCOUNTER — Ambulatory Visit
Admission: RE | Admit: 2015-07-19 | Discharge: 2015-07-19 | Disposition: A | Discharge status: Home / Self Care | Payer: Commercial Managed Care - PPO | Source: Ambulatory Visit | Attending: Emergency Medicine | Admitting: Emergency Medicine

## 2015-07-19 DIAGNOSIS — E041 Nontoxic single thyroid nodule: Secondary | ICD-10-CM

## 2015-07-21 ENCOUNTER — Other Ambulatory Visit: Payer: Self-pay | Admitting: Emergency Medicine

## 2015-07-21 DIAGNOSIS — E042 Nontoxic multinodular goiter: Secondary | ICD-10-CM

## 2015-08-28 ENCOUNTER — Encounter: Payer: Self-pay | Admitting: Endocrinology

## 2015-08-28 ENCOUNTER — Ambulatory Visit (INDEPENDENT_AMBULATORY_CARE_PROVIDER_SITE_OTHER): Payer: Commercial Managed Care - PPO | Admitting: Endocrinology

## 2015-08-28 VITALS — BP 124/80 | HR 105 | Ht 68.5 in | Wt 174.0 lb

## 2015-08-28 DIAGNOSIS — G609 Hereditary and idiopathic neuropathy, unspecified: Secondary | ICD-10-CM

## 2015-08-28 DIAGNOSIS — E042 Nontoxic multinodular goiter: Secondary | ICD-10-CM

## 2015-08-28 LAB — TSH: TSH: 1.05 u[IU]/mL (ref 0.35–4.50)

## 2015-08-28 LAB — HEMOGLOBIN A1C: HEMOGLOBIN A1C: 6.1 % (ref 4.6–6.5)

## 2015-08-28 LAB — T4, FREE: FREE T4: 0.85 ng/dL (ref 0.60–1.60)

## 2015-08-28 LAB — GLUCOSE, RANDOM: Glucose, Bld: 106 mg/dL — ABNORMAL HIGH (ref 70–99)

## 2015-08-28 NOTE — Progress Notes (Addendum)
Patient ID: Greg Johnson, male   DOB: 10/20/1965, 50 y.o.   MRN: [id number]           Reason for Appointment: Goiter, new consultation    History of Present Illness:   The patient's thyroid enlargement was first discovered in 2008 approximately Not clear what the initial evaluation was but no records are available The patient himself has not felt his thyroid enlargement  He has not had difficulty with swallowing  Does not feel like he has any choking sensation or pressure in the neck area No recent weight change  Lab Results  Component Value Date   FREET4 0.85 08/28/2015   TSH 1.05 08/28/2015   TSH 1.539 04/14/2014    He had ultrasound exams in 2016 showing multinodular goiter Repeat thyroid ultrasound in 5/17 showed increase in the size of the dominant nodules as follows:  Right thyroid lobe Measurements: 7.1 x 3.3 x 4.1 cm. Solitary 4.2 x 2.6 x 5.6 cm mostly solid nodule, mid/inferior lobe (previously 53 x 31 x 38).  Left thyroid lobe  Measurements: 6.4 x 2 x 2.1 cm. Several nodules. Largest 2.1 x 1.4 x 2.7 cm, solid, inferior pole (Previously 24 x 17 x 18).     Medication List    Notice  As of 08/28/2015  9:15 PM   You have not been prescribed any medications.      Allergies:  Allergies  Allergen Reactions  . Aspirin Nausea And Vomiting    Past Medical History  Diagnosis Date  . Allergy   . Anemia   . Anxiety   . Ulcer     History reviewed. No pertinent past surgical history.  Family History  Problem Relation Age of Onset  . Diabetes Neg Hx      Social History:  reports that he has never smoked. He has never used smokeless tobacco. He reports that he does not drink alcohol or use illicit drugs.      Review of Systems  Constitutional: Negative for weight loss and weight gain.  HENT: Negative for trouble swallowing.   Respiratory: Negative for shortness of breath.   Endocrine: Negative for fatigue and cold intolerance.  Neurological:   He complains of burning on the feet especially on the bottom for the last 3 years.  Has not been benefited by use of gabapentin currently and has been followed by podiatrist      Examination:   BP 124/80 mmHg  Pulse 105  Ht 5' 8.5" (1.74 m)  Wt 174 lb (78.926 kg)  BMI 26.07 kg/m2  SpO2 96%   General Appearance: pleasant, averagely built and nourished          Eyes: No abnormal prominence or eyelid swelling.          Neck: The thyroid is enlarged bilaterally but mostly on the right side . Neck circumference is  39 cm over the thyroid.  Right thyroid lobe measures about 4 cm, relatively soft and smooth Left lobe was just palpable on swallowing  There is no stridor. Pemberton sign is negative There is no lymphadenopathy.     Cardiovascular: Normal  heart sounds, no murmur Respiratory:  Lungs clear Neurological: REFLEXES: at biceps are normal.  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  We'll check A1c and glucose   Greg Johnson 08/28/2015   Note: This office note was prepared with Dragon voice recognition system technology. Any transcriptional errors that result from this process are unintentional.

## 2015-08-30 ENCOUNTER — Telehealth: Payer: Self-pay

## 2015-08-30 ENCOUNTER — Telehealth: Payer: Self-pay | Admitting: Endocrinology

## 2015-08-30 NOTE — Telephone Encounter (Signed)
Called patient to give lab results. No answer, left voicemail to call back with results.

## 2015-08-30 NOTE — Telephone Encounter (Signed)
Patient returned phone call. Gave him lab results. Patient has appointment in 6 months. Will follow up with him then. No other questions or concerns.

## 2015-08-30 NOTE — Telephone Encounter (Signed)
-----   Message from Greg LittlerAjay Kumar, MD sent at 08/28/2015  9:23 PM EDT ----- Thyroid level is normal.  Diabetes test is borderline although glucose today is not high, needs periodic follow-up

## 2015-08-30 NOTE — Telephone Encounter (Signed)
Patient is returning your call.  

## 2015-08-30 NOTE — Telephone Encounter (Signed)
Tried to return patient phone call. No answer.

## 2015-09-03 NOTE — Telephone Encounter (Signed)
Please advise, Thanks!  

## 2015-09-03 NOTE — Telephone Encounter (Signed)
It was done in May and they can see it in his record anyway

## 2015-09-03 NOTE — Telephone Encounter (Signed)
Victorino DikeJennifer from [facility] ask if patient has already had a ultra sound, if not he need one before he has the biopsy. Please advise [phone]

## 2015-09-04 ENCOUNTER — Other Ambulatory Visit: Payer: Self-pay | Admitting: Endocrinology

## 2015-09-04 DIAGNOSIS — E042 Nontoxic multinodular goiter: Secondary | ICD-10-CM

## 2015-09-04 NOTE — Telephone Encounter (Signed)
I contacted Victorino DikeJennifer with [facility] and left a vm advising US was done on 07/19/2015. Requested a call back if the she would like to discuss.

## 2015-09-17 ENCOUNTER — Ambulatory Visit (INDEPENDENT_AMBULATORY_CARE_PROVIDER_SITE_OTHER): Payer: Commercial Managed Care - PPO | Admitting: Physician Assistant

## 2015-09-17 VITALS — BP 132/80 | HR 76 | Temp 98.2°F | Resp 17 | Ht 69.5 in | Wt 175.0 lb

## 2015-09-17 DIAGNOSIS — L989 Disorder of the skin and subcutaneous tissue, unspecified: Secondary | ICD-10-CM

## 2015-09-17 NOTE — Patient Instructions (Addendum)
     IF you received an x-ray today, you will receive an invoice from [facility]. Please contact [facility] at [phone] with questions or concerns regarding your invoice.   IF you received labwork today, you will receive an invoice from [employer]. Please contact [facility] at [phone] with questions or concerns regarding your invoice.   Our billing staff will not be able to assist you with questions regarding bills from these companies.  You will be contacted with the lab results as soon as they are available. The fastest way to get your results is to activate your My Chart account. Instructions are located on the last page of this paperwork. If you have not heard from [place] regarding the results in 2 weeks, please contact this office.    Please await contact for referral to specialist.   We will also go ahead and do ultrasound while we are waiting to see what this is.

## 2015-09-17 NOTE — Progress Notes (Signed)
[facility] [address] [place] [address] [phone]  Date:  09/17/2015   Name:  Greg Johnson   DOB:  April 30, 1965   MRN:  [id number]  PCP:  Lonia Blood, MD    History of Present Illness:  Greg Johnson is a 50 y.o. male patient who presents to [facility] for cc of bump in head. --3 weeks ago, no trauma.  Little pustule he reports that has grown since.  There is no pain.  No pruritic.  No headache.  No dizziness.    Patient Active Problem List   Diagnosis Date Noted  . Neuropathy of both feet (HCC) 04/14/2014  . Thyroid nodule 04/14/2014    Past Medical History:  Diagnosis Date  . Allergy   . Anemia   . Anxiety   . Ulcer     No past surgical history on file.  Social History  Substance Use Topics  . Smoking status: Never Smoker  . Smokeless tobacco: Never Used  . Alcohol use No    Family History  Problem Relation Age of Onset  . Diabetes Neg Hx     Allergies  Allergen Reactions  . Aspirin Nausea And Vomiting    Medication list has been reviewed and updated.  No current outpatient prescriptions on file prior to visit.   No current facility-administered medications on file prior to visit.     ROS ROS otherwise unremarkable unless listed above.   Physical Examination: BP 132/80 (BP Location: Right Arm, Patient Position: Sitting, Cuff Size: Normal)   Pulse 76   Temp 98.2 F (36.8 C) (Oral)   Resp 17   Ht 5' 9.5" (1.765 m)   Wt 175 lb (79.4 kg)   SpO2 98%   BMI 25.47 kg/m  Ideal Body Weight: Weight in (lb) to have BMI = 25: 171.4  Physical Exam  Constitutional: He is oriented to person, place, and time. He appears well-developed and well-nourished. No distress.  HENT:  Head: Normocephalic and atraumatic.  Center of forehead fatty like growth, mobile and non-tender (possibility that this is fluctuant vs fatty).  No erythema, warmth, or change in skin texture.  Eyes: Conjunctivae and EOM are normal. Pupils are equal, round,  and reactive to light.  Cardiovascular: Normal rate.   Pulmonary/Chest: Effort normal. No respiratory distress.  Neurological: He is alert and oriented to person, place, and time.  Skin: Skin is warm and dry. He is not diaphoretic.  Psychiatric: He has a normal mood and affect. His behavior is normal.     Assessment and Plan: Greg Johnson is a 50 y.o. male who is here today for swelling at forehead. Lipoma, vs. Cyst. General surgery consult appreciated at this time. Benign skin lesion of forehead - Plan: US Soft Tissue Head/Neck, Ambulatory referral to General Surgery   Trena Platt, PA-C [facility] 09/17/2015 4:32 PM

## 2015-09-21 ENCOUNTER — Ambulatory Visit (INDEPENDENT_AMBULATORY_CARE_PROVIDER_SITE_OTHER): Payer: Commercial Managed Care - PPO | Admitting: Family Medicine

## 2015-09-21 VITALS — BP 130/72 | HR 74 | Temp 98.5°F | Resp 16 | Ht 69.0 in | Wt 175.0 lb

## 2015-09-21 DIAGNOSIS — M5442 Lumbago with sciatica, left side: Secondary | ICD-10-CM | POA: Diagnosis not present

## 2015-09-21 DIAGNOSIS — Z13220 Encounter for screening for lipoid disorders: Secondary | ICD-10-CM

## 2015-09-21 DIAGNOSIS — G629 Polyneuropathy, unspecified: Secondary | ICD-10-CM | POA: Diagnosis not present

## 2015-09-21 DIAGNOSIS — Z1211 Encounter for screening for malignant neoplasm of colon: Secondary | ICD-10-CM

## 2015-09-21 DIAGNOSIS — R030 Elevated blood-pressure reading, without diagnosis of hypertension: Secondary | ICD-10-CM | POA: Diagnosis not present

## 2015-09-21 DIAGNOSIS — IMO0001 Reserved for inherently not codable concepts without codable children: Secondary | ICD-10-CM

## 2015-09-21 DIAGNOSIS — D649 Anemia, unspecified: Secondary | ICD-10-CM

## 2015-09-21 DIAGNOSIS — Z136 Encounter for screening for cardiovascular disorders: Secondary | ICD-10-CM

## 2015-09-21 DIAGNOSIS — M5441 Lumbago with sciatica, right side: Secondary | ICD-10-CM

## 2015-09-21 DIAGNOSIS — Z0000 Encounter for general adult medical examination without abnormal findings: Secondary | ICD-10-CM

## 2015-09-21 LAB — LIPID PANEL
CHOL/HDL RATIO: 3 ratio (ref <=5.0)
Cholesterol: 136 mg/dL (ref 125–200)
HDL: 45 mg/dL (ref >=40)
LDL CALC: 80 mg/dL (ref <130)
TRIGLYCERIDES: 56 mg/dL (ref <150)
VLDL: 11 mg/dL (ref <30)

## 2015-09-21 LAB — CBC WITH DIFFERENTIAL/PLATELET
BASOS ABS: 54 {cells}/uL (ref 0–200)
Basophils Relative: 1 %
EOS PCT: 3 %
Eosinophils Absolute: 162 cells/uL (ref 15–500)
HEMATOCRIT: 44.1 % (ref 38.5–50.0)
HEMOGLOBIN: 14.6 g/dL (ref 13.2–17.1)
LYMPHS ABS: 2052 {cells}/uL (ref 850–3900)
LYMPHS PCT: 38 %
MCH: 29.6 pg (ref 27.0–33.0)
MCHC: 33.1 g/dL (ref 32.0–36.0)
MCV: 89.5 fL (ref 80.0–100.0)
MPV: 10.3 fL (ref 7.5–12.5)
Monocytes Absolute: 378 cells/uL (ref 200–950)
Monocytes Relative: 7 %
NEUTROS PCT: 51 %
Neutro Abs: 2754 cells/uL (ref 1500–7800)
Platelets: 269 10*3/uL (ref 140–400)
RBC: 4.93 MIL/uL (ref 4.20–5.80)
RDW: 14.3 % (ref 11.0–15.0)
WBC: 5.4 10*3/uL (ref 3.8–10.8)

## 2015-09-21 LAB — COMPLETE METABOLIC PANEL WITH GFR
ALT: 9 U/L (ref 9–46)
AST: 14 U/L (ref 10–35)
Albumin: 4.4 g/dL (ref 3.6–5.1)
Alkaline Phosphatase: 41 U/L (ref 40–115)
BUN: 21 mg/dL (ref 7–25)
CHLORIDE: 105 mmol/L (ref 98–110)
CO2: 25 mmol/L (ref 20–31)
Calcium: 9.4 mg/dL (ref 8.6–10.3)
Creat: 1.06 mg/dL (ref 0.70–1.33)
GFR, EST NON AFRICAN AMERICAN: 81 mL/min (ref >=60)
GLUCOSE: 93 mg/dL (ref 65–99)
POTASSIUM: 4.2 mmol/L (ref 3.5–5.3)
Sodium: 139 mmol/L (ref 135–146)
Total Bilirubin: 0.7 mg/dL (ref 0.2–1.2)
Total Protein: 6.6 g/dL (ref 6.1–8.1)

## 2015-09-21 LAB — VITAMIN B12: Vitamin B-12: 721 pg/mL (ref 200–1100)

## 2015-09-21 LAB — IFOBT (OCCULT BLOOD): IFOBT: NEGATIVE

## 2015-09-21 MED ORDER — GABAPENTIN 300 MG PO CAPS: 300.0000 mg | ORAL_CAPSULE | Freq: Three times a day (TID) | ORAL | 3 refills | Status: DC

## 2015-09-21 NOTE — Progress Notes (Signed)
Patient ID: Greg Johnson, male    DOB: Feb 23, 1966, 50 y.o.   MRN: [id number]  PCP: Lonia Blood, MD  Chief Complaint  Patient presents with  . Annual Exam    Subjective:   HPI Presents for a complete physical exam.  He reports overall good health. 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  He was recently seen by endocrinologist, Dr. Reather Littler for evaluation of thyroid nodules. He was last seen by endo on 08/28/15 and glucose and thyroid panel were normal.  He continues to experience neuropathy of both feet with worsening symptoms as night.  He has also seen a podiatrist without resolution of symptoms.  He tried and failed treatment with Gabapentin 300 mg nightly, but states he is open  trying the medication again at a higher dose. Denies hearing or vision changes. Last vision exam 2 years ago. Denies shortness of breath, chest pain, or palpitations. Denies urinary frequency, retention, or burning. Denies abdominal pain, indigestion, nausea or diarrhea.   Complains of a two month history of localized in mid-lower back pain.  Worst with standing at work. Relieved with rest.    Review of Systems  Constitutional: Negative.   HENT: Negative.   Eyes: Negative.   Respiratory: Negative.   Cardiovascular: Negative.   Gastrointestinal: Negative.   Endocrine: Negative.   Genitourinary: Negative.  Negative for decreased urine volume, penile swelling, scrotal swelling, testicular pain and urgency.  Musculoskeletal: Positive for back pain.       See HPI  Skin: Negative.   Allergic/Immunologic: Negative.   Neurological: Negative.   Hematological: Negative.   Psychiatric/Behavioral: Negative.        Patient Active Problem List   Diagnosis Date Noted  . Neuropathy of both feet (HCC) 04/14/2014  . Thyroid nodule 04/14/2014     Prior to Admission medications   Not on File     Allergies  Allergen Reactions  .  Aspirin Nausea And Vomiting       Objective:  Physical Exam  Constitutional: He is oriented to person, place, and time. He appears well-developed and well-nourished.  HENT:  Head: Normocephalic and atraumatic.  Eyes: Conjunctivae are normal. Pupils are equal, round, and reactive to light.  Neck: Normal range of motion. Neck supple.  Pulmonary/Chest: Effort normal and breath sounds normal.  Abdominal: Soft. Bowel sounds are normal.  Genitourinary: Rectum normal, prostate normal and penis normal. Rectal exam shows guaiac negative stool. No penile tenderness.  Musculoskeletal: Normal range of motion. He exhibits no tenderness.  Neurological: He is alert and oriented to person, place, and time. He has normal reflexes.  Skin: Skin is warm and dry.  Psychiatric: He has a normal mood and affect. His behavior is normal. Judgment and thought content normal.  Nursing note and vitals reviewed.          Assessment & Plan:  .1. Annual physical exam - IFOBT POC -negative  Age-appropriate anticipatory guidance provided  2. Encounter for lipid screening for cardiovascular disease -Lipid panel-controlled  Recheck lipid panel in 12 months  3. Anemia, unspecified anemia type - CBC with Differential-unremarkable No evidence of anemia   4. Neuropathy (HCC) - Vitamin B12-results pending  Gabapentin 300 mg, TID as needed.   5. Elevated blood pressure - COMPLETE METABOLIC PANEL WITH GFR -stable, controlled with diet and exercise Periodically check your blood pressure and keep a log of readings.  6. Bilateral low back pain with sciatica, sciatica  laterality unspecified Use proper body mechanics when lifting Avoid prolong standing.  Make frequent positional changes. Gabapentin 300 mg, TID as needed.  7. Special screening for malignant neoplasms, colon - Cologuard If normal, will need to be repeated within 3 years.   Will follow-up with you regarding lab results.  Godfrey Pick.  Tiburcio Pea, MSN, FNP-C [facility]

## 2015-09-21 NOTE — Patient Instructions (Addendum)
Will follow-up with lab results.  Blood pressure was sloghtly elevated today Please periodically check pressures and keep and log of readings. Return in 2 months to have blood pressure re-evaluated. Bring readings with you to visit.   Hypertension Hypertension, commonly called high blood pressure, is when the force of blood pumping through your arteries is too strong. Your arteries are the blood vessels that carry blood from your heart throughout your body. A blood pressure reading consists of a higher number over a lower number, such as 110/72. The higher number (systolic) is the pressure inside your arteries when your heart pumps. The lower number (diastolic) is the pressure inside your arteries when your heart relaxes. Ideally you want your blood pressure below 120/80. Hypertension forces your heart to work harder to pump blood. Your arteries may become narrow or stiff. Having untreated or uncontrolled hypertension can cause heart attack, stroke, kidney disease, and other problems. RISK FACTORS Some risk factors for high blood pressure are controllable. Others are not.  Risk factors you cannot control include:   Race. You may be at higher risk if you are African American.  Age. Risk increases with age.  Gender. Men are at higher risk than women before age 109 years. After age 33, women are at higher risk than men. Risk factors you can control include:  Not getting enough exercise or physical activity.  Being overweight.  Getting too much fat, sugar, calories, or salt in your diet.  Drinking too much alcohol. SIGNS AND SYMPTOMS Hypertension does not usually cause signs or symptoms. Extremely high blood pressure (hypertensive crisis) may cause headache, anxiety, shortness of breath, and nosebleed. DIAGNOSIS To check if you have hypertension, your health care provider will measure your blood pressure while you are seated, with your arm held at the level of your heart. It should be  measured at least twice using the same arm. Certain conditions can cause a difference in blood pressure between your right and left arms. A blood pressure reading that is higher than normal on one occasion does not mean that you need treatment. If it is not clear whether you have high blood pressure, you may be asked to return on a different day to have your blood pressure checked again. Or, you may be asked to monitor your blood pressure at home for 1 or more weeks. TREATMENT Treating high blood pressure includes making lifestyle changes and possibly taking medicine. Living a healthy lifestyle can help lower high blood pressure. You may need to change some of your habits. Lifestyle changes may include:  Following the DASH diet. This diet is high in fruits, vegetables, and whole grains. It is low in salt, red meat, and added sugars.  Keep your sodium intake below 2,300 mg per day.  Getting at least 30-45 minutes of aerobic exercise at least 4 times per week.  Losing weight if necessary.  Not smoking.  Limiting alcoholic beverages.  Learning ways to reduce stress. Your health care provider may prescribe medicine if lifestyle changes are not enough to get your blood pressure under control, and if one of the following is true:  You are 41-83 years of age and your systolic blood pressure is above 140.  You are 15 years of age or older, and your systolic blood pressure is above 150.  Your diastolic blood pressure is above 90.  You have diabetes, and your systolic blood pressure is over 140 or your diastolic blood pressure is over 90.  You have kidney disease  and your blood pressure is above 140/90.  You have heart disease and your blood pressure is above 140/90. Your personal target blood pressure may vary depending on your medical conditions, your age, and other factors. HOME CARE INSTRUCTIONS  Have your blood pressure rechecked as directed by your health care provider.   Take  medicines only as directed by your health care provider. Follow the directions carefully. Blood pressure medicines must be taken as prescribed. The medicine does not work as well when you skip doses. Skipping doses also puts you at risk for problems.  Do not smoke.   Monitor your blood pressure at home as directed by your health care provider. SEEK MEDICAL CARE IF:   You think you are having a reaction to medicines taken.  You have recurrent headaches or feel dizzy.  You have swelling in your ankles.  You have trouble with your vision. SEEK IMMEDIATE MEDICAL CARE IF:  You develop a severe headache or confusion.  You have unusual weakness, numbness, or feel faint.  You have severe chest or abdominal pain.  You vomit repeatedly.  You have trouble breathing. MAKE SURE YOU:   Understand these instructions.  Will watch your condition.  Will get help right away if you are not doing well or get worse.   This information is not intended to replace advice given to you by your health care provider. Make sure you discuss any questions you have with your health care provider.   Document Released: 02/09/2005 Document Revised: 06/26/2014 Document Reviewed: 12/02/2012 Elsevier Interactive Patient Education 2016 Elsevier Inc.     Neuropathic Pain Neuropathic pain is pain caused by damage to the nerves that are responsible for certain sensations in your body (sensory nerves). The pain can be caused by damage to:   The sensory nerves that send signals to your spinal cord and brain (peripheral nervous system).  The sensory nerves in your brain or spinal cord (central nervous system). Neuropathic pain can make you more sensitive to pain. What would be a minor sensation for most people may feel very painful if you have neuropathic pain. This is usually a long-term condition that can be difficult to treat. The type of pain can differ from person to person. It may start suddenly (acute),  or it may develop slowly and last for a long time (chronic). Neuropathic pain may come and go as damaged nerves heal or may stay at the same level for years. It often causes emotional distress, loss of sleep, and a lower quality of life. CAUSES  The most common cause of damage to a sensory nerve is diabetes. Many other diseases and conditions can also cause neuropathic pain. Causes of neuropathic pain can be classified as:  Toxic. Many drugs and chemicals can cause toxic damage. The most common cause of toxic neuropathic pain is damage from drug treatment for cancer (chemotherapy).  Metabolic. This type of pain can happen when a disease causes imbalances that damage nerves. Diabetes is the most common of these diseases. Vitamin B deficiency caused by long-term alcohol abuse is another common cause.  Traumatic. Any injury that cuts, crushes, or stretches a nerve can cause damage and pain. A common example is feeling pain after losing an arm or leg (phantom limb pain).  Compression-related. If a sensory nerve gets trapped or compressed for a long period of time, the blood supply to the nerve can be cut off.  Vascular. Many blood vessel diseases can cause neuropathic pain by decreasing blood supply  and oxygen to nerves.  Autoimmune. This type of pain results from diseases in which the body's defense system mistakenly attacks sensory nerves. Examples of autoimmune diseases that can cause neuropathic pain include lupus and multiple sclerosis.  Infectious. Many types of viral infections can damage sensory nerves and cause pain. Shingles infection is a common cause of this type of pain.  Inherited. Neuropathic pain can be a symptom of many diseases that are passed down through families (genetic). SIGNS AND SYMPTOMS  The main symptom is pain. Neuropathic pain is often described as:  Burning.  Shock-like.  Stinging.  Hot or cold.  Itching. DIAGNOSIS  No single test can diagnose neuropathic  pain. Your health care provider will do a physical exam and ask you about your pain. You may use a pain scale to describe how bad your pain is. You may also have tests to see if you have a high sensitivity to pain and to help find the cause and location of any sensory nerve damage. These tests may include:  Imaging studies, such as:  X-rays.  CT scan.  MRI.  Nerve conduction studies to test how well nerve signals travel through your sensory nerves (electrodiagnostic testing).  Stimulating your sensory nerves through electrodes on your skin and measuring the response in your spinal cord and brain (somatosensory evoked potentials). TREATMENT  Treatment for neuropathic pain may change over time. You may need to try different treatment options or a combination of treatments. Some options include:  Over-the-counter pain relievers.  Prescription medicines. Some medicines used to treat other conditions may also help neuropathic pain. These include medicines to:  Control seizures (anticonvulsants).  Relieve depression (antidepressants).  Prescription-strength pain relievers (narcotics). These are usually used when other pain relievers do not help.  Transcutaneous nerve stimulation (TENS). This uses electrical currents to block painful nerve signals. The treatment is painless.  Topical and local anesthetics. These are medicines that numb the nerves. They can be injected as a nerve block or applied to the skin.  Alternative treatments, such as:  Acupuncture.  Meditation.  Massage.  Physical therapy.  Pain management programs.  Counseling. HOME CARE INSTRUCTIONS  Learn as much as you can about your condition.  Take medicines only as directed by your health care provider.  Work closely with all your health care providers to find what works best for you.  Have a good support system at home.  Consider joining a chronic pain support group. SEEK MEDICAL CARE IF:  Your pain  treatments are not helping.  You are having side effects from your medicines.  You are struggling with fatigue, mood changes, depression, or anxiety.   This information is not intended to replace advice given to you by your health care provider. Make sure you discuss any questions you have with your health care provider.   Document Released: 11/07/2003 Document Revised: 03/02/2014 Document Reviewed: 07/20/2013 Elsevier Interactive Patient Education 2016 [employer].    IF you received an x-ray today, you will receive an invoice from [facility]. Please contact [facility] at [phone] with questions or concerns regarding your invoice.   IF you received labwork today, you will receive an invoice from [employer]. Please contact [facility] at [phone] with questions or concerns regarding your invoice.   Our billing staff will not be able to assist you with questions regarding bills from these companies.  You will be contacted with the lab results as soon as they are available. The fastest way to get  your results is to activate your My Chart account. Instructions are located on the last page of this paperwork. If you have not heard from [place] regarding the results in 2 weeks, please contact this office.

## 2015-09-22 ENCOUNTER — Encounter: Payer: Self-pay | Admitting: Family Medicine

## 2015-09-23 ENCOUNTER — Encounter: Payer: Self-pay | Admitting: Family Medicine

## 2015-09-25 ENCOUNTER — Telehealth: Payer: Self-pay | Admitting: *Deleted

## 2015-09-25 ENCOUNTER — Telehealth: Payer: Self-pay | Admitting: Family Medicine

## 2015-09-25 NOTE — Telephone Encounter (Signed)
Pt calliing about lab results.

## 2015-09-25 NOTE — Telephone Encounter (Signed)
Returned patients call advising of letter sent with information pertaining to lab results.

## 2015-09-27 ENCOUNTER — Encounter: Payer: Self-pay | Admitting: Physician Assistant

## 2015-09-30 ENCOUNTER — Telehealth: Payer: Self-pay

## 2015-09-30 NOTE — Telephone Encounter (Signed)
Routed to labs pool.

## 2015-09-30 NOTE — Telephone Encounter (Signed)
Patient called as asked to speak to Joaquin CourtsKimberly Harris about lab results. I informed patient that labs will give him a call once reviewed. Please call!  [phone]

## 2015-10-23 IMAGING — US US SOFT TISSUE HEAD/NECK
1 series · 14 of 25 positions shown · non-contrast
Comparison: None.

CLINICAL DATA: 49-year-old male with a history of thyroid nodules

EXAM:
THYROID ULTRASOUND
TECHNIQUE: Ultrasound examination of the thyroid gland and adjacent soft
tissues was performed.

[Series 1: us soft tissue head/neck · 0.08mm/px · 14 of 45 slices shown]
[im 1/45]
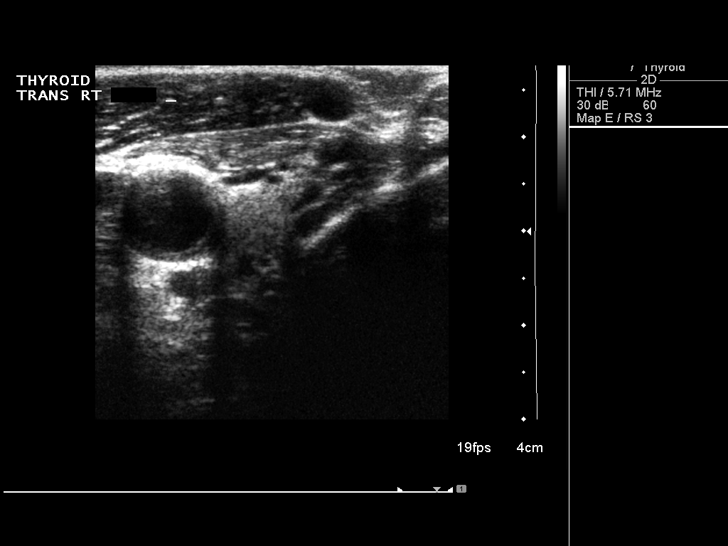
[im 4/45]
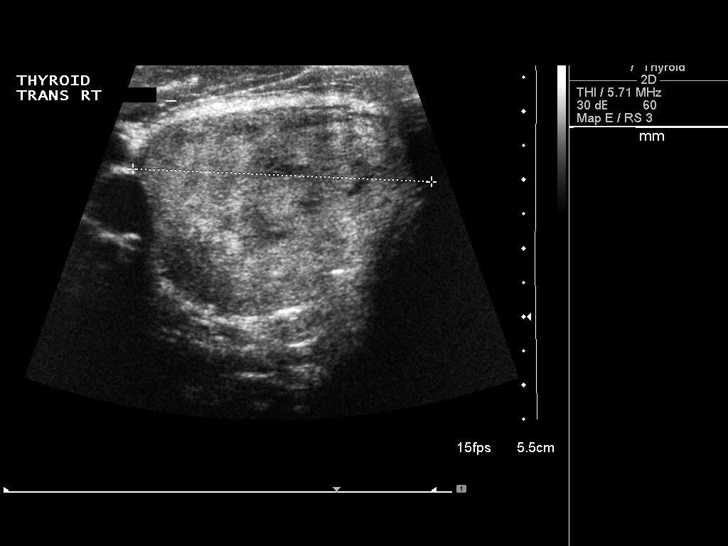
[im 8/45]
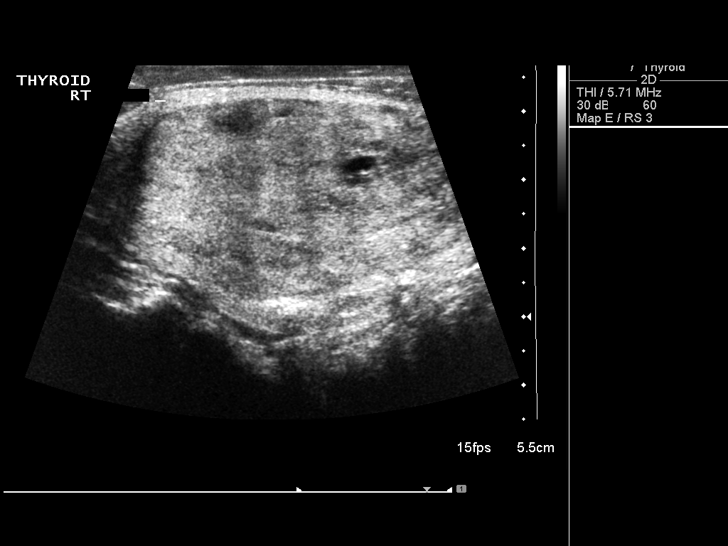
[im 12/45]
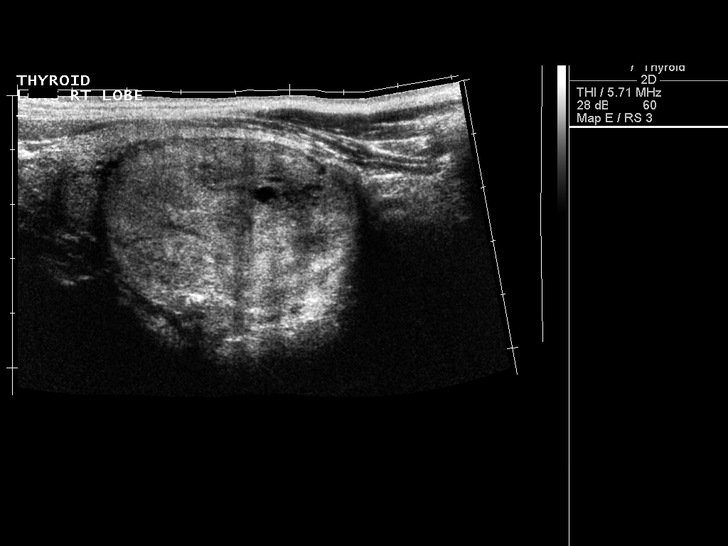
[im 15/45]
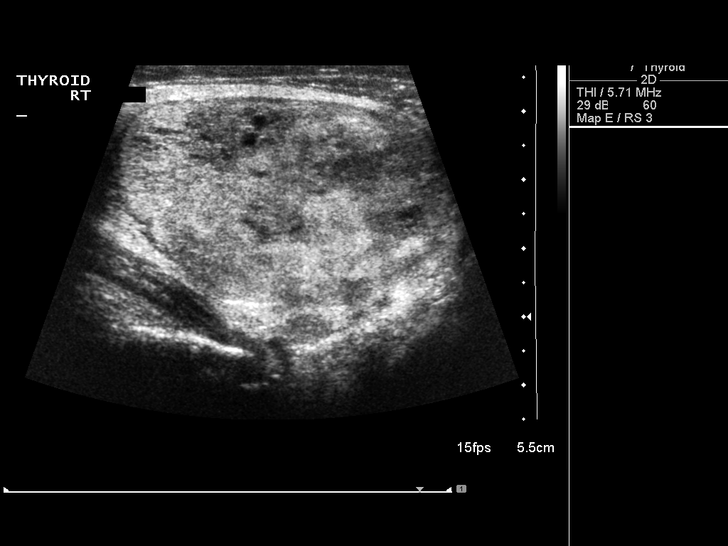
[im 17/45]
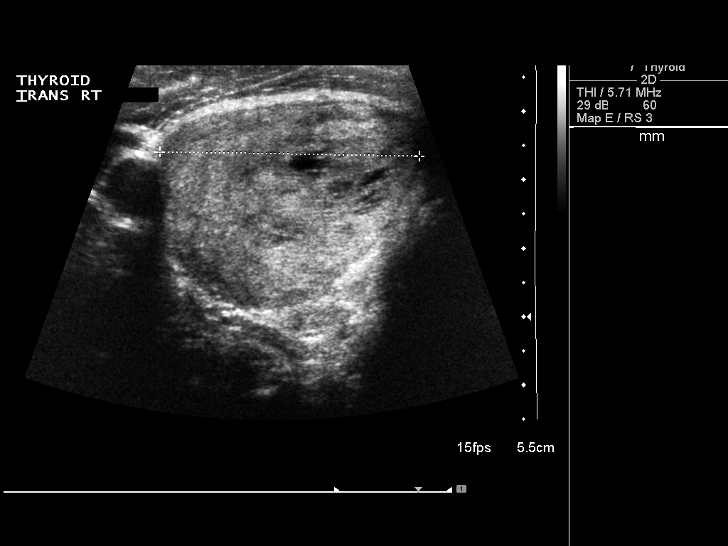
[im 21/45]
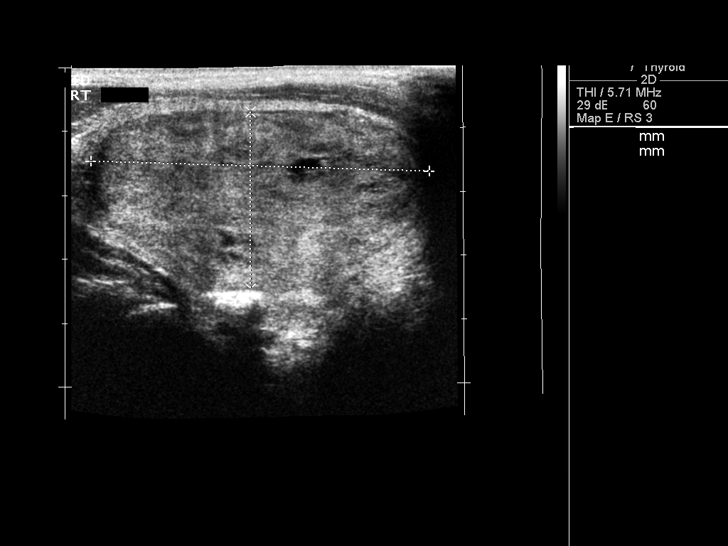
[im 24/45]
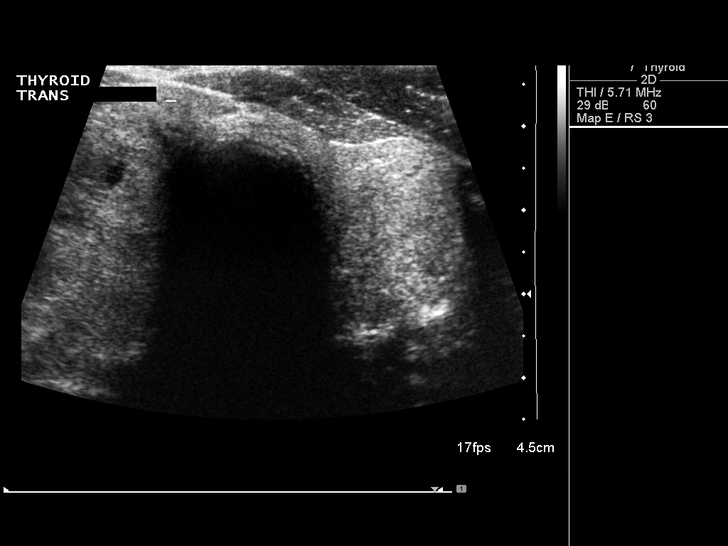
[im 28/45]
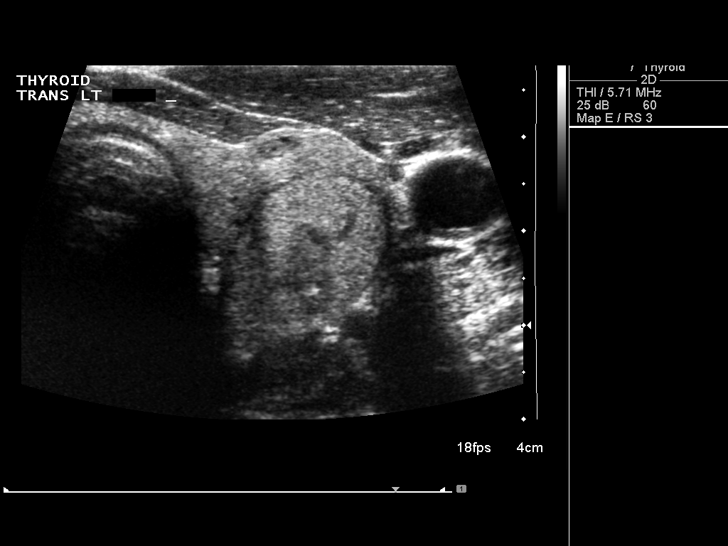
[im 30/45]
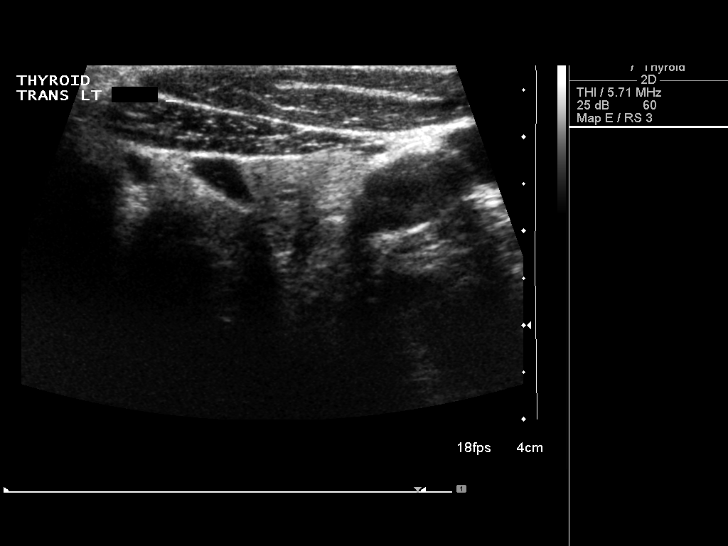
[im 34/45]
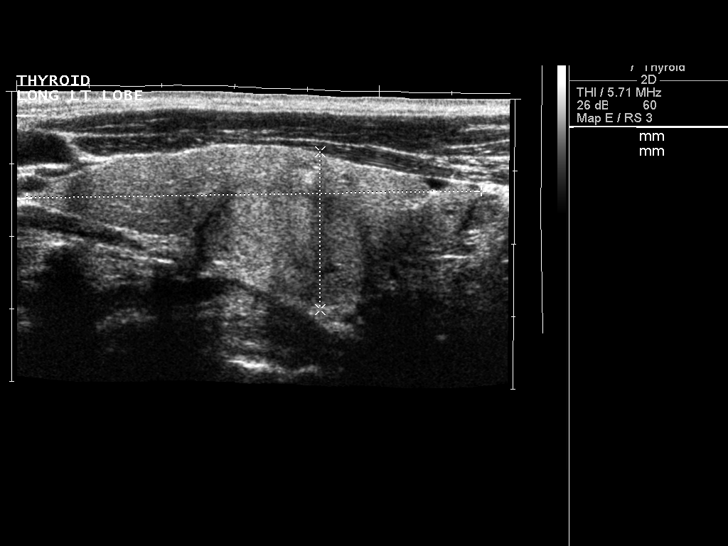
[im 37/45]
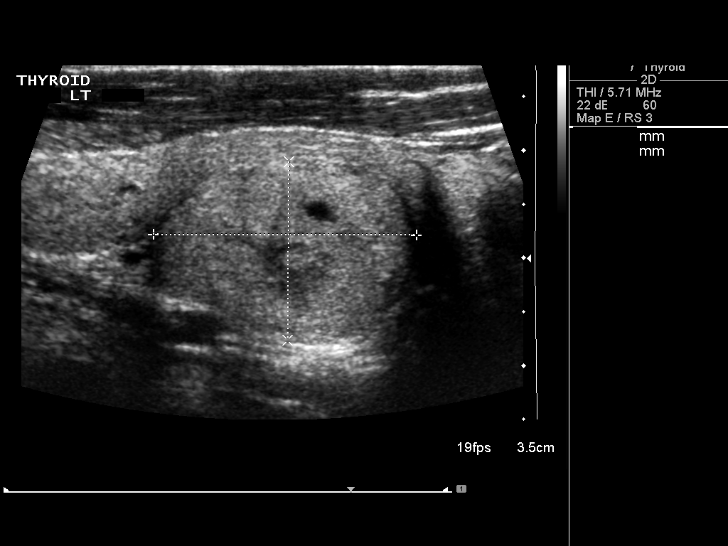
[im 41/45]
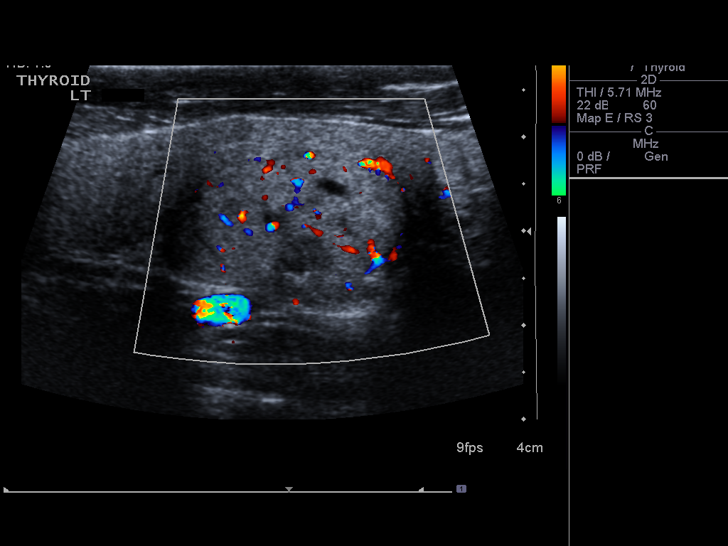
[im 45/45]
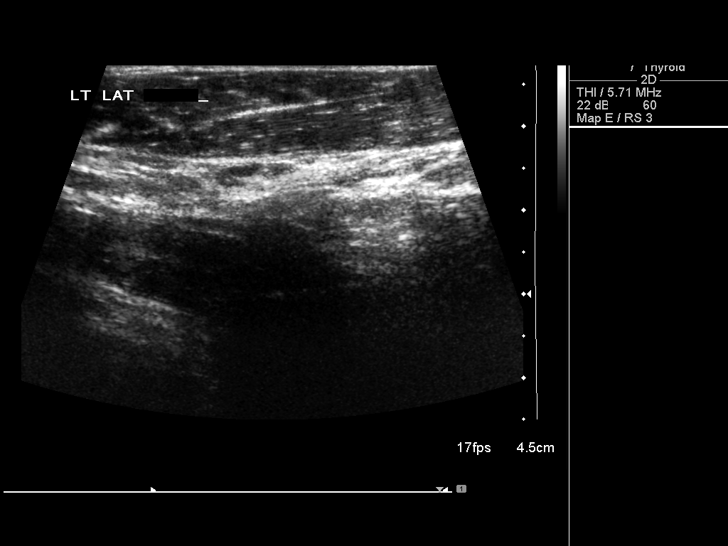

[14 of 25 positions shown; findings below may reference images not displayed]

FINDINGS: Right thyroid lobe

Measurements: 7.5 cm x 3.9 cm x 4.4 cm. Dominant nodule of the right
lobe with heterogeneous solid characteristics. 5.3 cm x 3.1 cm x
cm

Left thyroid lobe

Measurements: 6.3 cm x 2.2 cm x 2.4 cm. Single nodule on the left
measures 2.4 cm x 1.7 cm x 1.8 cm.

Isthmus

Thickness: 2 mm.  No nodules visualized.

Lymphadenopathy

None visualized.
IMPRESSION: Multinodular thyroid. Both the left and the right meet criteria for
biopsy.

Ultrasound-guided fine needle aspiration should be considered, as
per the consensus statement: Management of Thyroid Nodules Detected
at US: Society of Radiologists in Ultrasound Consensus Conference

## 2015-12-01 ENCOUNTER — Other Ambulatory Visit: Payer: Self-pay | Admitting: Emergency Medicine

## 2015-12-11 LAB — COLOGUARD

## 2015-12-16 ENCOUNTER — Telehealth: Payer: Self-pay | Admitting: *Deleted

## 2015-12-16 ENCOUNTER — Telehealth: Payer: Self-pay | Admitting: Family Medicine

## 2015-12-16 NOTE — Telephone Encounter (Signed)
Returned patient call and reinforced that Cologuard was normal. He wanted to let me know that he is going into have lesion removed from his face tomorrow by general surgery. He will contact me and advise how his procedure goes.  Greg PickKimberly S. Tiburcio PeaHarris, MSN, FNP-C [facility]

## 2015-12-16 NOTE — Telephone Encounter (Signed)
Patient would like a call back from you personally.  Tried to get what he would like to ask and he would not tell me.    I had initially called about cologuard and he stated that he did send another one back in (first one was empty).  Advised that results was negative.

## 2016-01-18 ENCOUNTER — Ambulatory Visit (INDEPENDENT_AMBULATORY_CARE_PROVIDER_SITE_OTHER): Payer: Commercial Managed Care - PPO

## 2016-01-18 ENCOUNTER — Ambulatory Visit (INDEPENDENT_AMBULATORY_CARE_PROVIDER_SITE_OTHER): Payer: Commercial Managed Care - PPO | Admitting: Family Medicine

## 2016-01-18 VITALS — BP 124/76 | HR 81 | Temp 97.9°F | Resp 16 | Ht 69.25 in | Wt 179.2 lb

## 2016-01-18 DIAGNOSIS — M25512 Pain in left shoulder: Secondary | ICD-10-CM

## 2016-01-18 DIAGNOSIS — W57XXXA Bitten or stung by nonvenomous insect and other nonvenomous arthropods, initial encounter: Secondary | ICD-10-CM | POA: Diagnosis not present

## 2016-01-18 MED ORDER — MELOXICAM 15 MG PO TABS: 15.0000 mg | ORAL_TABLET | Freq: Every day | ORAL | 1 refills | Status: DC

## 2016-01-18 MED ORDER — CLOTRIMAZOLE-BETAMETHASONE 1-0.05 % EX CREA: 1.0000 "application " | TOPICAL_CREAM | Freq: Two times a day (BID) | CUTANEOUS | 0 refills | Status: DC

## 2016-01-18 NOTE — Patient Instructions (Addendum)
Take Meloxicam 15 mg once at bedtime for shoulder pain as needed.  Apply Lotrisone cream two both lesion on upper back. If they worsen or surrounding area becomes red, return for care or call to request antibiotic.   IF you received an x-ray today, you will receive an invoice from [facility]. Please contact [facility] at [phone] with questions or concerns regarding your invoice.   IF you received labwork today, you will receive an invoice from [facility]. Please contact [facility] at [phone] with questions or concerns regarding your invoice.   Our billing staff will not be able to assist you with questions regarding bills from these companies.  You will be contacted with the lab results as soon as they are available. The fastest way to get your results is to activate your My Chart account. Instructions are located on the last page of this paperwork. If you have not heard from us regarding the results in 2 weeks, please contact this office.    Shoulder Pain Many things can cause shoulder pain, including:  An injury to the area.  Overuse of the shoulder.  Arthritis. The source of the pain can be:  Inflammation.  An injury to the shoulder joint.  An injury to a tendon, ligament, or bone. Follow these instructions at home: Take these actions to help with your pain:  Squeeze a soft ball or a foam pad as much as possible. This helps to keep the shoulder from swelling. It also helps to strengthen the arm.  Take over-the-counter and prescription medicines only as told by your health care provider.  If directed, apply ice to the area:  Put ice in a plastic bag.  Place a towel between your skin and the bag.  Leave the ice on for 20 minutes, 2-3 times per day. Stop applying ice if it does not help with the pain.  If you were given a shoulder sling or immobilizer:  Wear it as told.  Remove it to shower or bathe.  Move your  arm as little as possible, but keep your hand moving to prevent swelling. Contact a health care provider if:  Your pain gets worse.  Your pain is not relieved with medicines.  New pain develops in your arm, hand, or fingers. Get help right away if:  Your arm, hand, or fingers:  Tingle.  Become numb.  Become swollen.  Become painful.  Turn white or blue. This information is not intended to replace advice given to you by your health care provider. Make sure you discuss any questions you have with your health care provider. Document Released: 11/19/2004 Document Revised: 10/06/2015 Document Reviewed: 06/04/2014 Elsevier Interactive Patient Education  2017 [employer]

## 2016-01-18 NOTE — Progress Notes (Signed)
Patient ID: Greg Johnson, male    DOB: 11/14/1965, 50 y.o.   MRN: [id number]  PCP: Lonia BloodGARBA,LAWAL, MD  Chief Complaint  Patient presents with  . Shoulder Pain    Left, x 74month    Subjective:   HPI [age] year male presents for evaluation of left shoulder pain times 1 month. [facility]  Shoulder Pain  While in bed sleeping, experiences a sudden aching pain. Able move without pain after he is up moving around in the morning. He has applied heat and Beng[place] for pain relief. He takes Aleve before bed to prevent pain but it still occurs frequently.   Insect Bite Two pruritic red lesions on upper back times times this morning. Uncertain of if he was actually bitten. Site is non-tender to touch.  Social History   Social History  . Marital status: Married    Spouse name: N/A  . Number of children: N/A  . Years of education: N/A   Occupational History  . Not on file.   Social History Main Topics  . Smoking status: Never Smoker  . Smokeless tobacco: Never Used  . Alcohol use No  . Drug use:     Types: Other-see comments  . Sexual activity: Yes   Other Topics Concern  . Not on file   Social History Narrative  . No narrative on file    Family History  Problem Relation Age of Onset  . Diabetes Neg Hx     Review of Systems See HPI   Patient Active Problem List   Diagnosis Date Noted  . Elevated blood pressure 09/21/2015  . Neuropathy of both feet 04/14/2014  . Thyroid nodule 04/14/2014     Prior to Admission medications   Medication Sig Start Date End Date Taking? Authorizing Provider  gabapentin (NEURONTIN) 300 MG capsule Take 1 capsule (300 mg total) by mouth 3 (three) times daily. 09/21/15  Yes Doyle AskewKimberly Stephenia Quenna Doepke, FNP    Allergies  Allergen Reactions  . Aspirin Nausea And Vomiting      Objective:  Physical Exam  Constitutional: He is oriented to person, place, and time. He appears well-developed and well-nourished.  HENT:    Head: Normocephalic and atraumatic.  Right Ear: External ear normal.  Left Ear: External ear normal.  Eyes: Conjunctivae are normal. Pupils are equal, round, and reactive to light.  Neck: Normal range of motion. Neck supple.  Cardiovascular: Normal rate, regular rhythm, normal heart sounds and intact distal pulses.   Pulmonary/Chest: Effort normal and breath sounds normal.  Musculoskeletal:       Left shoulder: He exhibits normal range of motion, no tenderness, no bony tenderness, no swelling, no effusion and no crepitus.  Lymphadenopathy:    He has no cervical adenopathy.  Neurological: He is alert and oriented to person, place, and time.  Skin: Skin is warm and dry. Rash noted. Rash is urticarial. Rash is not pustular. There is erythema.       Vitals:   01/18/16 1404  BP: 124/76  Pulse: 81  Resp: 16  Temp: 97.9 F (36.6 C)   Dg Shoulder Left  Result Date: 01/18/2016 CLINICAL DATA:  Left shoulder pain for 1 month without known injury. EXAM: LEFT SHOULDER - 2+ VIEW COMPARISON:  None. FINDINGS: There is no evidence of fracture or dislocation. There is no evidence of arthropathy or other focal bone abnormality. Soft tissues are unremarkable. IMPRESSION: Normal left shoulder. Electronically Signed   By: Lupita RaiderJames  Green Jr, M.D.  On: 01/18/2016 14:39   Assessment & Plan:  1. Acute pain of left shoulder - DG Shoulder Left -Meloxicam 15 mg daily as needed for shoulder pain.  2. Insect bite, initial encounter -Apply clotrimazole-betamethasone 1 application, 2 times daily.  Return for follow-up as needed.  Godfrey PickKimberly S. Tiburcio PeaHarris, MSN, FNP-C [facility]

## 2016-02-01 ENCOUNTER — Other Ambulatory Visit: Payer: Self-pay | Admitting: Family Medicine

## 2016-02-01 NOTE — Telephone Encounter (Signed)
01/18/16 lotrisone was given for ? Insect bite, do you want to refill this ?

## 2016-02-28 ENCOUNTER — Ambulatory Visit: Payer: Commercial Managed Care - PPO | Admitting: Endocrinology

## 2016-05-19 ENCOUNTER — Ambulatory Visit (INDEPENDENT_AMBULATORY_CARE_PROVIDER_SITE_OTHER): Payer: Commercial Managed Care - PPO | Admitting: Physician Assistant

## 2016-05-19 VITALS — BP 143/88 | HR 89 | Temp 98.1°F | Resp 16 | Ht 69.25 in | Wt 172.0 lb

## 2016-05-19 DIAGNOSIS — L503 Dermatographic urticaria: Secondary | ICD-10-CM | POA: Diagnosis not present

## 2016-05-19 DIAGNOSIS — J3089 Other allergic rhinitis: Secondary | ICD-10-CM | POA: Diagnosis not present

## 2016-05-19 DIAGNOSIS — K649 Unspecified hemorrhoids: Secondary | ICD-10-CM

## 2016-05-19 DIAGNOSIS — K5900 Constipation, unspecified: Secondary | ICD-10-CM | POA: Diagnosis not present

## 2016-05-19 DIAGNOSIS — J301 Allergic rhinitis due to pollen: Secondary | ICD-10-CM | POA: Diagnosis not present

## 2016-05-19 MED ORDER — HYDROCORTISONE ACETATE 25 MG RE SUPP: 25.0000 mg | Freq: Two times a day (BID) | RECTAL | 0 refills | Status: AC

## 2016-05-19 MED ORDER — HYDROCORTISONE 1 % EX CREA: 1.0000 "application " | TOPICAL_CREAM | Freq: Two times a day (BID) | CUTANEOUS | 0 refills | Status: AC

## 2016-05-19 MED ORDER — POLYETHYLENE GLYCOL 3350 17 GM/SCOOP PO POWD: 17.0000 g | Freq: Two times a day (BID) | ORAL | 1 refills | Status: DC | PRN

## 2016-05-19 NOTE — Progress Notes (Signed)
   Greg Johnson  MRN: [id number] DOB: 04/09/1965  PCP: Lonia BloodGARBA,LAWAL, MD  Subjective:  Pt is a 51 year old male PMH HTN who presents to clinic for pain in groin. C/o lump on anus. Pain when he has a bowel movement. No pain present when he is not having bowel movement. No pain with sitting. He spends about 10 minutes on the toilet.  His bowel movements are sometimes little balls, sometimes watery.  This has happened to him in the past several years ago. He put a cream on it and it went away.  He does not drink water. He eats rice, chicken, some vegetables. Does not exercise.  Denies blood in stool, fever, chills.   Review of Systems  Gastrointestinal: Positive for constipation, diarrhea and rectal pain. Negative for abdominal distention, abdominal pain, anal bleeding, blood in stool, nausea and vomiting.    Patient Active Problem List   Diagnosis Date Noted  . Elevated blood pressure 09/21/2015  . Neuropathy of both feet 04/14/2014  . Thyroid nodule 04/14/2014    Current Outpatient Prescriptions on File Prior to Visit  Medication Sig Dispense Refill  . clotrimazole-betamethasone (LOTRISONE) cream APPLY 1 APPLICATION TOPICALLY 2 (TWO) TIMES DAILY. (Patient not taking: Reported on 05/19/2016) 30 g 0  . gabapentin (NEURONTIN) 300 MG capsule Take 1 capsule (300 mg total) by mouth 3 (three) times daily. (Patient not taking: Reported on 05/19/2016) 90 capsule 3  . meloxicam (MOBIC) 15 MG tablet Take 1 tablet (15 mg total) by mouth daily. (Patient not taking: Reported on 05/19/2016) 30 tablet 1   No current facility-administered medications on file prior to visit.     Allergies  Allergen Reactions  . Aspirin Nausea And Vomiting     Objective:  BP (!) 143/88   Pulse 89   Temp 98.1 F (36.7 C) (Oral)   Resp 16   Ht 5' 9.25" (1.759 m)   Wt 172 lb (78 kg)   SpO2 98%   BMI 25.22 kg/m   Physical Exam  Constitutional: He is oriented to person, place, and time and well-developed,  well-nourished, and in no distress. No distress.  Cardiovascular: Normal rate, regular rhythm and normal heart sounds.   Genitourinary: Rectal exam shows external hemorrhoid (reducible) and tenderness. Rectal exam shows no fissure and no laceration.  Neurological: He is alert and oriented to person, place, and time. GCS score is 15.  Skin: Skin is warm and dry.  Psychiatric: Mood, memory, affect and judgment normal.  Vitals reviewed.   Assessment and Plan :  1. Hemorrhoids, unspecified hemorrhoid type - hydrocortisone (ANUSOL-HC) 25 MG suppository; Place 1 suppository (25 mg total) rectally 2 (two) times daily.  Dispense: 28 suppository; Refill: 0 - hydrocortisone cream 1 %; Apply 1 application topically 2 (two) times daily.  Dispense: 30 g; Refill: 0 - Healthy bowel habits discussed and printed out for pt: squatty potty, do not strain, do not sit on toilet for extended periods of time, correct constipation.   2. Constipation, unspecified constipation type - polyethylene glycol powder (GLYCOLAX/MIRALAX) powder; Take 17 g by mouth 2 (two) times daily as needed. Increase dose until diarrhea, then back off a little until you have well formed stool.  Dispense: 3350 g; Refill: 1 - Constipation information discussed with pt. RTC if no improvement in 3-4 weeks.   Marco CollieWhitney Jonel Weldon, PA-C  [facility] 05/19/2016 5:04 PM

## 2016-05-19 NOTE — Patient Instructions (Addendum)
Eats 25-30 g of fiber/day - do this with a supplement or through your diet.  Do not strain with bowel movements. Get a small stool (squatty potty) and place this in front of your toilet -put your feet on this while you have a bowel movement.   Miralax - mix in any beverage (hot or cold),  just not carbinated. Take one capful daily - Increase the dose until you have diarrhea, then back off a little until you have well formed bowel movement.   Come back if you are not better in 3-4 weeks.   Hemorrhoids Hemorrhoids are swollen veins in and around the rectum or anus. There are two types of hemorrhoids:  Internal hemorrhoids. These occur in the veins that are just inside the rectum. They may poke through to the outside and become irritated and painful.  External hemorrhoids. These occur in the veins that are outside of the anus and can be felt as a painful swelling or hard lump near the anus. Most hemorrhoids do not cause serious problems, and they can be managed with home treatments such as diet and lifestyle changes. If home treatments do not help your symptoms, procedures can be done to shrink or remove the hemorrhoids. What are the causes? This condition is caused by increased pressure in the anal area. This pressure may result from various things, including:  Constipation.  Straining to have a bowel movement.  Diarrhea.  Pregnancy.  Obesity.  Sitting for long periods of time.  Heavy lifting or other activity that causes you to strain.  Anal sex. What are the signs or symptoms? Symptoms of this condition include:  Pain.  Anal itching or irritation.  Rectal bleeding.  Leakage of stool (feces).  Anal swelling.  One or more lumps around the anus. How is this diagnosed? This condition can often be diagnosed through a visual exam. Other exams or tests may also be done, such as:  Examination of the rectal area with a gloved hand (digital rectal exam).  Examination of the  anal canal using a small tube (anoscope).  A blood test, if you have lost a significant amount of blood.  A test to look inside the colon (sigmoidoscopy or colonoscopy). How is this treated? This condition can usually be treated at home. However, various procedures may be done if dietary changes, lifestyle changes, and other home treatments do not help your symptoms. These procedures can help make the hemorrhoids smaller or remove them completely. Some of these procedures involve surgery, and others do not. Common procedures include:  Rubber band ligation. Rubber bands are placed at the base of the hemorrhoids to cut off the blood supply to them.  Sclerotherapy. Medicine is injected into the hemorrhoids to shrink them.  Infrared coagulation. A type of light energy is used to get rid of the hemorrhoids.  Hemorrhoidectomy surgery. The hemorrhoids are surgically removed, and the veins that supply them are tied off.  Stapled hemorrhoidopexy surgery. A circular stapling device is used to remove the hemorrhoids and use staples to cut off the blood supply to them. Follow these instructions at home: Eating and drinking   Eat foods that have a lot of fiber in them, such as whole grains, beans, nuts, fruits, and vegetables. Ask your health care provider about taking products that have added fiber (fiber supplements).  Drink enough fluid to keep your urine clear or pale yellow. Managing pain and swelling   Take warm sitz baths for 20 minutes, 3-4 times a day  to ease pain and discomfort.  If directed, apply ice to the affected area. Using ice packs between sitz baths may be helpful.  Put ice in a plastic bag.  Place a towel between your skin and the bag.  Leave the ice on for 20 minutes, 2-3 times a day. General instructions   Take over-the-counter and prescription medicines only as told by your health care provider.  Use medicated creams or suppositories as told.  Exercise  regularly.  Go to the bathroom when you have the urge to have a bowel movement. Do not wait.  Avoid straining to have bowel movements.  Keep the anal area dry and clean. Use wet toilet paper or moist towelettes after a bowel movement.  Do not sit on the toilet for long periods of time. This increases blood pooling and pain. Contact a health care provider if:  You have increasing pain and swelling that are not controlled by treatment or medicine.  You have uncontrolled bleeding.  You have difficulty having a bowel movement, or you are unable to have a bowel movement.  You have pain or inflammation outside the area of the hemorrhoids. This information is not intended to replace advice given to you by your health care provider. Make sure you discuss any questions you have with your health care provider. Document Released: 02/07/2000 Document Revised: 07/10/2015 Document Reviewed: 10/24/2014 Elsevier Interactive Patient Education  2017 [employer].   Constipation, Adult Constipation is when a person has fewer bowel movements in a week than normal, has difficulty having a bowel movement, or has stools that are dry, hard, or larger than normal. Constipation may be caused by an underlying condition. It may become worse with age if a person takes certain medicines and does not take in enough fluids. Follow these instructions at home: Eating and drinking    Eat foods that have a lot of fiber, such as fresh fruits and vegetables, whole grains, and beans.  Limit foods that are high in fat, low in fiber, or overly processed, such as french fries, hamburgers, cookies, candies, and soda.  Drink enough fluid to keep your urine clear or pale yellow. General instructions   Exercise regularly or as told by your health care provider.  Go to the restroom when you have the urge to go. Do not hold it in.  Take over-the-counter and prescription medicines only as told by your health care provider.  These include any fiber supplements.  Practice pelvic floor retraining exercises, such as deep breathing while relaxing the lower abdomen and pelvic floor relaxation during bowel movements.  Watch your condition for any changes.  Keep all follow-up visits as told by your health care provider. This is important. Contact a health care provider if:  You have pain that gets worse.  You have a fever.  You do not have a bowel movement after 4 days.  You vomit.  You are not hungry.  You lose weight.  You are bleeding from the anus.  You have thin, pencil-like stools. Get help right away if:  You have a fever and your symptoms suddenly get worse.  You leak stool or have blood in your stool.  Your abdomen is bloated.  You have severe pain in your abdomen.  You feel dizzy or you faint. This information is not intended to replace advice given to you by your health care provider. Make sure you discuss any questions you have with your health care provider. Document Released: 11/08/2003 Document Revised: 08/30/2015  Document Reviewed: 07/31/2015 Elsevier Interactive Patient Education  2017 [employer]

## 2016-06-03 ENCOUNTER — Ambulatory Visit (INDEPENDENT_AMBULATORY_CARE_PROVIDER_SITE_OTHER): Payer: Commercial Managed Care - PPO | Admitting: Emergency Medicine

## 2016-06-03 VITALS — BP 143/75 | HR 69 | Temp 97.9°F | Resp 16 | Ht 69.25 in | Wt 171.2 lb

## 2016-06-03 DIAGNOSIS — M7918 Myalgia, other site: Secondary | ICD-10-CM

## 2016-06-03 DIAGNOSIS — M791 Myalgia: Secondary | ICD-10-CM | POA: Diagnosis not present

## 2016-06-03 DIAGNOSIS — L0231 Cutaneous abscess of buttock: Secondary | ICD-10-CM

## 2016-06-03 MED ORDER — CEPHALEXIN 500 MG PO CAPS: 500.0000 mg | ORAL_CAPSULE | Freq: Three times a day (TID) | ORAL | 1 refills | Status: AC

## 2016-06-03 NOTE — Patient Instructions (Addendum)
     IF you received an x-ray today, you will receive an invoice from [facility]. Please contact [facility] at [phone] with questions or concerns regarding your invoice.   IF you received labwork today, you will receive an invoice from [place] [facility]. Please contact [facility] at [phone] with questions or concerns regarding your invoice.   Our billing staff will not be able to assist you with questions regarding bills from these companies.  You will be contacted with the lab results as soon as they are available. The fastest way to get your results is to activate your My Chart account. Instructions are located on the last page of this paperwork. If you have not heard from [place] regarding the results in 2 weeks, please contact this office.      Skin Abscess A skin abscess is an infected area on or under your skin that contains pus and other material. An abscess can happen almost anywhere on your body. Some abscesses break open (rupture) on their own. Most continue to get worse unless they are treated. The infection can spread deeper into the body and into your blood, which can make you feel sick. Treatment usually involves draining the abscess. Follow these instructions at home: Abscess Care   If you have an abscess that has not drained, place a warm, clean, wet washcloth over the abscess several times a day. Do this as told by your doctor.  Follow instructions from your doctor about how to take care of your abscess. Make sure you:  Cover the abscess with a bandage (dressing).  Change your bandage or gauze as told by your doctor.  Wash your hands with soap and water before you change the bandage or gauze. If you cannot use soap and water, use hand sanitizer.  Check your abscess every day for signs that the infection is getting worse. Check for:  More redness, swelling, or pain.  More fluid or blood.  Warmth.  More pus or a bad smell. Medicines    Take  over-the-counter and prescription medicines only as told by your doctor.  If you were prescribed an antibiotic medicine, take it as told by your doctor. Do not stop taking the antibiotic even if you start to feel better. General instructions   To avoid spreading the infection:  Do not share personal care items, towels, or hot tubs with others.  Avoid making skin-to-skin contact with other people.  Keep all follow-up visits as told by your doctor. This is important. Contact a doctor if:  You have more redness, swelling, or pain around your abscess.  You have more fluid or blood coming from your abscess.  Your abscess feels warm when you touch it.  You have more pus or a bad smell coming from your abscess.  You have a fever.  Your muscles ache.  You have chills.  You feel sick. Get help right away if:  You have very bad (severe) pain.  You see red streaks on your skin spreading away from the abscess. This information is not intended to replace advice given to you by your health care provider. Make sure you discuss any questions you have with your health care provider. Document Released: 07/29/2007 Document Revised: 10/06/2015 Document Reviewed: 12/19/2014 Elsevier Interactive Patient Education  2017 [employer].

## 2016-06-03 NOTE — Progress Notes (Signed)
Greg Johnson 51 y.o.   Chief Complaint  Patient presents with  . Mass    near the rectal are same issue from previous visit in March     HISTORY OF PRESENT ILLNESS: This is a 51 y.o. male complaining of painful lump to right buttock area x 2 weeks.  HPI   Prior to Admission medications   Medication Sig Start Date End Date Taking? Authorizing Provider  levocetirizine (XYZAL) 5 MG tablet Take 5 mg by mouth every evening.   Yes Historical Provider, MD  polyethylene glycol powder (GLYCOLAX/MIRALAX) powder Take 17 g by mouth 2 (two) times daily as needed. Increase dose until diarrhea, then back off a little until you have well formed stool. 05/19/16  Yes Elizabeth Whitney McVey, PA-C  clotrimazole-betamethasone (LOTRISONE) cream APPLY 1 APPLICATION TOPICALLY 2 (TWO) TIMES DAILY. Patient not taking: Reported on 05/19/2016 02/01/16   Doyle Askew, FNP  gabapentin (NEURONTIN) 300 MG capsule Take 1 capsule (300 mg total) by mouth 3 (three) times daily. Patient not taking: Reported on 05/19/2016 09/21/15   Doyle Askew, FNP  meloxicam (MOBIC) 15 MG tablet Take 1 tablet (15 mg total) by mouth daily. Patient not taking: Reported on 05/19/2016 01/18/16   Doyle Askew, FNP    Allergies  Allergen Reactions  . Aspirin Nausea And Vomiting    Patient Active Problem List   Diagnosis Date Noted  . Elevated blood pressure 09/21/2015  . Neuropathy of both feet 04/14/2014  . Thyroid nodule 04/14/2014    Past Medical History:  Diagnosis Date  . Allergy   . Anemia   . Anxiety   . Ulcer (HCC)     No past surgical history on file.  Social History   Social History  . Marital status: Married    Spouse name: N/A  . Number of children: N/A  . Years of education: N/A   Occupational History  . Not on file.   Social History Main Topics  . Smoking status: Never Smoker  . Smokeless tobacco: Never Used  . Alcohol use No  . Drug use: Yes    Types:  Other-see comments  . Sexual activity: Yes   Other Topics Concern  . Not on file   Social History Narrative  . No narrative on file    Family History  Problem Relation Age of Onset  . Diabetes Neg Hx      Review of Systems  Constitutional: Negative.  Negative for chills and fever.  HENT: Negative.   Eyes: Negative.   Respiratory: Negative.  Negative for shortness of breath.   Cardiovascular: Negative.  Negative for chest pain and leg swelling.  Gastrointestinal: Positive for constipation. Negative for abdominal pain, blood in stool, diarrhea, melena, nausea and vomiting.  Genitourinary: Negative for dysuria and hematuria.  Skin: Negative for rash.  Neurological: Negative.  Negative for dizziness and headaches.  Endo/Heme/Allergies: Negative.   All other systems reviewed and are negative.   Vitals:   06/03/16 1657  BP: (!) 143/75  Pulse: 69  Resp: 16  Temp: 97.9 F (36.6 C)    Physical Exam  Constitutional: He is oriented to person, place, and time. He appears well-developed and well-nourished.  HENT:  Head: Normocephalic and atraumatic.  Nose: Nose normal.  Mouth/Throat: Oropharynx is clear and moist. No oropharyngeal exudate.  Eyes: Conjunctivae are normal. Pupils are equal, round, and reactive to light.  Neck: Normal range of motion. Neck supple.  Cardiovascular: Normal rate and regular rhythm.   Pulmonary/Chest:  Effort normal and breath sounds normal.  Abdominal: Bowel sounds are normal. He exhibits no distension. There is no tenderness.  Neurological: He is alert and oriented to person, place, and time. No sensory deficit. He exhibits normal muscle tone.  Skin: Skin is dry.  Right buttock: hard, slightly tender and erythematous area c/w early abscess; also has a pustular lesion closer to perianal area, not draining, hard and slightly tender. No fluctuation or crepitation.  Psychiatric: He has a normal mood and affect. His behavior is normal.  Vitals  reviewed.    ASSESSMENT & PLAN: Login was seen today for mass.  Diagnoses and all orders for this visit:  Abscess of right buttock Comments: early  Buttock pain  Other orders -     cephALEXin (KEFLEX) 500 MG capsule; Take 1 capsule (500 mg total) by mouth 3 (three) times daily.    Patient Instructions       IF you received an x-ray today, you will receive an invoice from [facility]. Please contact [facility] at [phone] with questions or concerns regarding your invoice.   IF you received labwork today, you will receive an invoice from [place]. Please contact [facility] at [phone] with questions or concerns regarding your invoice.   Our billing staff will not be able to assist you with questions regarding bills from these companies.  You will be contacted with the lab results as soon as they are available. The fastest way to get your results is to activate your My Chart account. Instructions are located on the last page of this paperwork. If you have not heard from [place] regarding the results in 2 weeks, please contact this office.      Skin Abscess A skin abscess is an infected area on or under your skin that contains pus and other material. An abscess can happen almost anywhere on your body. Some abscesses break open (rupture) on their own. Most continue to get worse unless they are treated. The infection can spread deeper into the body and into your blood, which can make you feel sick. Treatment usually involves draining the abscess. Follow these instructions at home: Abscess Care   If you have an abscess that has not drained, place a warm, clean, wet washcloth over the abscess several times a day. Do this as told by your doctor.  Follow instructions from your doctor about how to take care of your abscess. Make sure you:  Cover the abscess with a bandage (dressing).  Change your bandage or gauze as told by your doctor.  Wash your hands  with soap and water before you change the bandage or gauze. If you cannot use soap and water, use hand sanitizer.  Check your abscess every day for signs that the infection is getting worse. Check for:  More redness, swelling, or pain.  More fluid or blood.  Warmth.  More pus or a bad smell. Medicines    Take over-the-counter and prescription medicines only as told by your doctor.  If you were prescribed an antibiotic medicine, take it as told by your doctor. Do not stop taking the antibiotic even if you start to feel better. General instructions   To avoid spreading the infection:  Do not share personal care items, towels, or hot tubs with others.  Avoid making skin-to-skin contact with other people.  Keep all follow-up visits as told by your doctor. This is important. Contact a doctor if:  You have more redness, swelling, or pain around your abscess.  You have more fluid or blood coming from your abscess.  Your abscess feels warm when you touch it.  You have more pus or a bad smell coming from your abscess.  You have a fever.  Your muscles ache.  You have chills.  You feel sick. Get help right away if:  You have very bad (severe) pain.  You see red streaks on your skin spreading away from the abscess. This information is not intended to replace advice given to you by your health care provider. Make sure you discuss any questions you have with your health care provider. Document Released: 07/29/2007 Document Revised: 10/06/2015 Document Reviewed: 12/19/2014 Elsevier Interactive Patient Education  2017 [employer]vier Inc.      Edwina Barth, MD [facility]

## 2016-07-07 ENCOUNTER — Other Ambulatory Visit: Payer: Self-pay | Admitting: Family Medicine

## 2016-07-31 ENCOUNTER — Other Ambulatory Visit: Payer: Self-pay | Admitting: Endocrinology

## 2016-07-31 DIAGNOSIS — E042 Nontoxic multinodular goiter: Secondary | ICD-10-CM

## 2016-08-05 ENCOUNTER — Other Ambulatory Visit: Payer: Commercial Managed Care - PPO

## 2016-08-07 ENCOUNTER — Encounter: Payer: Self-pay | Admitting: Endocrinology

## 2016-08-07 ENCOUNTER — Ambulatory Visit (INDEPENDENT_AMBULATORY_CARE_PROVIDER_SITE_OTHER): Payer: Commercial Managed Care - PPO | Admitting: Endocrinology

## 2016-08-07 VITALS — BP 130/82 | HR 84 | Temp 96.0°F | Ht 69.25 in | Wt 168.8 lb

## 2016-08-07 DIAGNOSIS — E042 Nontoxic multinodular goiter: Secondary | ICD-10-CM

## 2016-08-07 LAB — T4, FREE: FREE T4: 0.75 ng/dL (ref 0.60–1.60)

## 2016-08-07 LAB — TSH: TSH: 1.43 u[IU]/mL (ref 0.35–4.50)

## 2016-08-07 NOTE — Progress Notes (Signed)
Patient ID: Greg Johnson, male   DOB: 05/20/1965, [age] y.o.   MRN: [id number]           Reason for Appointment: Goiter, follow-up    History of Present Illness:   The patient's thyroid enlargement was first discovered in 2008  Not clear what the initial evaluation was but no records are available The patient himself has not felt his thyroid enlargement  On his last visit because of increasing size of the right sided solid nodule was recommended needle aspiration However he did not schedule this and has not come back for follow-up until today  He has not had difficulty with swallowing  Does not feel like he has any choking sensation or pressure in the neck area Thyroid levels have been normal previously  Lab Results  Component Value Date   FREET4 0.85 08/28/2015   TSH 1.05 08/28/2015   TSH 1.539 04/14/2014    He had ultrasound exams in 2016 showing multinodular goiter Repeat thyroid ultrasound in 5/17 showed increase in the size of the dominant nodules as follows:  Right thyroid lobe Measurements: 7.1 x 3.3 x 4.1 cm. Solitary 4.2 x 2.6 x 5.6 cm mostly solid nodule, mid/inferior lobe (previously 53 x 31 x 38).  Left thyroid lobe  Measurements: 6.4 x 2 x 2.1 cm. Several nodules. Largest 2.1 x 1.4 x 2.7 cm, solid, inferior pole (Previously 24 x 17 x 18).   Allergies as of 08/07/2016      Reactions   Aspirin Nausea And Vomiting      Medication List       Accurate as of 08/07/16  3:45 PM. Always use your most recent med list.          clotrimazole-betamethasone cream Commonly known as:  LOTRISONE APPLY 1 APPLICATION TOPICALLY 2 (TWO) TIMES DAILY.   gabapentin 300 MG capsule Commonly known as:  NEURONTIN Take 1 capsule (300 mg total) by mouth 3 (three) times daily.   levocetirizine 5 MG tablet Commonly known as:  XYZAL Take 5 mg by mouth every evening.   meloxicam 15 MG tablet Commonly known as:  MOBIC Take 1 tablet (15 mg total) by mouth daily.     polyethylene glycol powder powder Commonly known as:  GLYCOLAX/MIRALAX Take 17 g by mouth 2 (two) times daily as needed. Increase dose until diarrhea, then back off a little until you have well formed stool.       Allergies:  Allergies  Allergen Reactions  . Aspirin Nausea And Vomiting    Past Medical History:  Diagnosis Date  . Allergy   . Anemia   . Anxiety   . Ulcer     No past surgical history on file.  Family History  Problem Relation Age of Onset  . Diabetes Neg Hx      Social History:  reports that he has never smoked. He has never used smokeless tobacco. He reports that he uses drugs, including Other-see comments. He reports that he does not drink alcohol.      Review of Systems      Examination:   BP 130/82   Pulse 84   Temp (!) 96 F (35.6 C)   Ht 5' 9.25" (1.759 m)   Wt 168 lb 12.8 oz (76.6 kg)   BMI 24.75 kg/m             Neck: The thyroid is enlarged bilaterally, Larger on the right Neck circumference is 39.5 cm over the thyroid.   Right thyroid  lobe swelling his mood, slightly firm and measures about 4 cm Left lobe just palpable on swallowing  There is no lymphadenopathy.     REFLEXES: at biceps are normal.    Assessment/Plan:  Multinodular goiter with long history  He has a significant right-sided nodule which was apparently increasing size on his ultrasound in 2017 compared to the 03/14/2014 He had been recommended needle aspiration but he is not sure why he did not schedule this Clinically appears to have about the same size of thyroid enlargement and the right lobe is only mildly firm and smooth  and very stable  Since it has been a year and we need to follow-up on his nodules will get another ultrasound Described to him the need for follow-up and likely needle aspiration biopsy to evaluate any change in size compared to last year Recheck thyroid levels today also   Jacob Chamblee 08/07/2016   Note: This office note was  prepared with Dragon voice recognition system technology. Any transcriptional errors that result from this process are unintentional.

## 2016-08-21 ENCOUNTER — Ambulatory Visit
Admission: RE | Admit: 2016-08-21 | Discharge: 2016-08-21 | Disposition: A | Discharge status: Home / Self Care | Payer: Commercial Managed Care - PPO | Source: Ambulatory Visit | Attending: Endocrinology | Admitting: Endocrinology

## 2016-08-21 ENCOUNTER — Encounter (HOSPITAL_COMMUNITY): Payer: Self-pay | Admitting: Emergency Medicine

## 2016-08-21 ENCOUNTER — Ambulatory Visit (HOSPITAL_COMMUNITY)
Admission: EM | Admit: 2016-08-21 | Discharge: 2016-08-21 | Disposition: A | Discharge status: Home / Self Care | Payer: Commercial Managed Care - PPO | Attending: Internal Medicine | Admitting: Internal Medicine

## 2016-08-21 DIAGNOSIS — H04123 Dry eye syndrome of bilateral lacrimal glands: Secondary | ICD-10-CM | POA: Diagnosis not present

## 2016-08-21 DIAGNOSIS — E042 Nontoxic multinodular goiter: Secondary | ICD-10-CM

## 2016-08-21 DIAGNOSIS — H1013 Acute atopic conjunctivitis, bilateral: Secondary | ICD-10-CM

## 2016-08-21 MED ORDER — POLYETHYL GLYCOL-PROPYL GLYCOL 0.4-0.3 % OP SOLN: 1.0000 [drp] | OPHTHALMIC | 0 refills | Status: DC

## 2016-08-21 MED ORDER — OLOPATADINE HCL 0.2 % OP SOLN: 1.0000 [drp] | Freq: Every day | OPHTHALMIC | 0 refills | Status: DC

## 2016-08-21 NOTE — ED Provider Notes (Signed)
CSN: [id number]     Arrival date & time 08/21/16  1622 History   None    Chief Complaint  Patient presents with  . Allergies  . Eye Problem   (Consider location/radiation/quality/duration/timing/severity/associated sxs/prior Treatment) 51 yo male with PMH of corneal abrasion of the left eye comes in with eye irritation for the past few days. Patient states that he started feeling like there was dust in his left eye a few days ago, and it then moved to the right. Watering of the eye with eye redness. Hs some eye itching. Denies vision changes, photophobia, eye pain. Has had some sneezing and runny nose. Denies fever, chills, night sweats. Denies cough, congestion.       Past Medical History:  Diagnosis Date  . Allergy   . Anemia   . Anxiety   . Ulcer    History reviewed. No pertinent surgical history. Family History  Problem Relation Age of Onset  . Diabetes Neg Hx    Social History  Substance Use Topics  . Smoking status: Never Smoker  . Smokeless tobacco: Never Used  . Alcohol use No    Review of Systems  Constitutional: Negative for chills, diaphoresis, fatigue and fever.  HENT: Positive for sneezing. Negative for congestion, ear pain, facial swelling, postnasal drip, rhinorrhea, sinus pain, sinus pressure, sore throat and trouble swallowing.   Eyes: Positive for discharge, redness and itching. Negative for photophobia, pain and visual disturbance.  Respiratory: Negative for cough, shortness of breath and wheezing.   Cardiovascular: Negative for chest pain and palpitations.    Allergies  Aspirin  Home Medications   Prior to Admission medications   Medication Sig Start Date End Date Taking? Authorizing Provider  clotrimazole-betamethasone (LOTRISONE) cream APPLY 1 APPLICATION TOPICALLY 2 (TWO) TIMES DAILY. 02/01/16   Bing NeighborsHarris, Kimberly S, FNP  gabapentin (NEURONTIN) 300 MG capsule Take 1 capsule (300 mg total) by mouth 3 (three) times daily. Patient not taking:  Reported on 05/19/2016 09/21/15   Bing NeighborsHarris, Kimberly S, FNP  levocetirizine (XYZAL) 5 MG tablet Take 5 mg by mouth every evening.    [provider]  meloxicam (MOBIC) 15 MG tablet Take 1 tablet (15 mg total) by mouth daily. 01/18/16   Bing NeighborsHarris, Kimberly S, FNP  Olopatadine HCl 0.2 % SOLN Apply 1 drop to eye daily. 08/21/16   Cathie HoopsYu, Amy V, PA-C  Polyethyl Glycol-Propyl Glycol (SYSTANE) 0.4-0.3 % SOLN Apply 1 drop to eye every 4 (four) hours. 08/21/16   Cathie HoopsYu, Amy V, PA-C  polyethylene glycol powder (GLYCOLAX/MIRALAX) powder Take 17 g by mouth 2 (two) times daily as needed. Increase dose until diarrhea, then back off a little until you have well formed stool. 05/19/16   McVey, Madelaine BhatElizabeth Whitney, PA-C   Meds Ordered and Administered this Visit  Medications - No data to display  BP 126/88 (BP Location: Left Arm)   Pulse 87   Temp 98 F (36.7 C) (Oral)   Resp 16   Wt 168 lb (76.2 kg)   SpO2 97%   BMI 24.63 kg/m  No data found.   Physical Exam  Constitutional: He appears well-developed and well-nourished. No distress.  HENT:  Head: Normocephalic and atraumatic.  Right Ear: Tympanic membrane, external ear and ear canal normal.  Left Ear: Tympanic membrane, external ear and ear canal normal.  Nose: Nose normal. Right sinus exhibits no maxillary sinus tenderness and no frontal sinus tenderness. Left sinus exhibits no maxillary sinus tenderness and no frontal sinus tenderness.  Mouth/Throat: Uvula is midline,  oropharynx is clear and moist and mucous membranes are normal.  Eyes: EOM and lids are normal. Pupils are equal, round, and reactive to light. Lids are everted and swept, no foreign bodies found. Right eye exhibits no discharge and no hordeolum. No foreign body present in the right eye. Left eye exhibits no discharge and no hordeolum. No foreign body present in the left eye. Right conjunctiva is injected. Right conjunctiva has no hemorrhage. Left conjunctiva is injected. Left conjunctiva has no  hemorrhage.  Fluorescein stain under woods lamp without corneal abrasion, ulcers.     Urgent Care Course     Procedures (including critical care time)  Labs Review Labs Reviewed - No data to display  Imaging Review No results found.      MDM   1. Allergic conjunctivitis of both eyes   2. Dry eyes    1. Start Pataday OU for allergic conjunctivitis. Start Systane artificial tear drops for dry eyes. Patient to monitor for vision changes, photophobia, eye pain.    Belinda Fisher, PA-C 08/21/16 1735

## 2016-08-21 NOTE — ED Triage Notes (Signed)
PT reports allergy symptoms and bilateral eye redness and itching

## 2016-08-26 NOTE — Progress Notes (Signed)
Please call to let patient know that the ultrasound shows an increase in the left-sided nodule and right nodule is still very large Recommend doing needle aspiration biopsy to rule out any signs of cancer, this will be done with ultrasound at same location If he is agreeable will schedule

## 2016-08-28 ENCOUNTER — Telehealth: Payer: Self-pay | Admitting: Endocrinology

## 2016-08-28 NOTE — Telephone Encounter (Signed)
Patient calling to get lab results before the weekend. Please advise.

## 2016-08-28 NOTE — Telephone Encounter (Signed)
Patient would like results of his labs.

## 2016-08-28 NOTE — Telephone Encounter (Signed)
Called patient and gave him lab results. He has agreed to do the needle aspiration biopsy procedure. I have sent a message in the result notes to Dr. Lucianne MussKumar.

## 2016-08-28 NOTE — Telephone Encounter (Signed)
Called patient and left a voice message for him to call back so I can go over lab results.

## 2016-08-28 NOTE — Telephone Encounter (Signed)
-----   Message from Olga MillersLea C Gillie sent at 08/28/2016  9:12 AM EDT ----- Regarding: no show fees Contact: [phone] Patient upset that he was sent to collections over no show fees. Please call.

## 2016-08-28 NOTE — Telephone Encounter (Signed)
LM for pt to let him know that [facility]

## 2016-08-30 ENCOUNTER — Other Ambulatory Visit: Payer: Self-pay | Admitting: Endocrinology

## 2016-08-30 DIAGNOSIS — E042 Nontoxic multinodular goiter: Secondary | ICD-10-CM

## 2016-09-02 ENCOUNTER — Other Ambulatory Visit: Payer: Self-pay | Admitting: Endocrinology

## 2016-09-02 DIAGNOSIS — E041 Nontoxic single thyroid nodule: Secondary | ICD-10-CM

## 2016-09-04 ENCOUNTER — Telehealth: Payer: Self-pay | Admitting: Endocrinology

## 2016-09-04 NOTE — Telephone Encounter (Signed)
[facility] is needing an additional order placed for another thyroid biopsy. They need 2 orders because there are two nodules. Please call Latisha @ [phone] when done

## 2016-09-04 NOTE — Telephone Encounter (Signed)
Please see message and place orders. Thank you!

## 2016-09-07 ENCOUNTER — Other Ambulatory Visit: Payer: Self-pay | Admitting: Endocrinology

## 2016-09-07 DIAGNOSIS — E042 Nontoxic multinodular goiter: Secondary | ICD-10-CM

## 2016-09-07 NOTE — Telephone Encounter (Signed)
Done, please call and let them know

## 2016-09-09 NOTE — Telephone Encounter (Signed)
Left vm explaing lab orders have been placed correctly and if he had any further questions he could give us a call

## 2016-09-09 NOTE — Telephone Encounter (Signed)
Patient is aware, no further questions, No further action required.

## 2016-09-11 NOTE — Telephone Encounter (Signed)
Patient called to inquire about picking up something at the office, I advised him that I did not see any note about picking up anything and told him again that the lab orders have been placed. Patient has no further questions. Call patient if there was something further needing to be done.

## 2016-09-29 ENCOUNTER — Ambulatory Visit
Admission: RE | Admit: 2016-09-29 | Discharge: 2016-09-29 | Disposition: A | Discharge status: Home / Self Care | Payer: Commercial Managed Care - PPO | Source: Ambulatory Visit | Attending: Endocrinology | Admitting: Endocrinology

## 2016-09-29 ENCOUNTER — Other Ambulatory Visit (HOSPITAL_COMMUNITY)
Admission: RE | Admit: 2016-09-29 | Discharge: 2016-09-29 | Disposition: A | Discharge status: Home / Self Care | Payer: Commercial Managed Care - PPO | Source: Ambulatory Visit | Attending: Radiology | Admitting: Radiology

## 2016-09-29 DIAGNOSIS — E079 Disorder of thyroid, unspecified: Secondary | ICD-10-CM | POA: Diagnosis not present

## 2016-09-29 DIAGNOSIS — E042 Nontoxic multinodular goiter: Secondary | ICD-10-CM | POA: Insufficient documentation

## 2016-09-29 DIAGNOSIS — E041 Nontoxic single thyroid nodule: Secondary | ICD-10-CM

## 2016-10-03 DIAGNOSIS — E041 Nontoxic single thyroid nodule: Secondary | ICD-10-CM | POA: Diagnosis not present

## 2016-10-15 ENCOUNTER — Encounter (HOSPITAL_COMMUNITY): Payer: Self-pay

## 2016-10-15 NOTE — Progress Notes (Signed)
Please call to let patient know that the genetic testing on the pathology is benign, no surgery needed, he'll continue to keep his appointment

## 2016-10-19 ENCOUNTER — Telehealth: Payer: Self-pay | Admitting: Endocrinology

## 2016-10-19 ENCOUNTER — Telehealth: Payer: Self-pay

## 2016-10-19 NOTE — Telephone Encounter (Signed)
Patient returning phone call about lab results Please advise.

## 2016-10-19 NOTE — Telephone Encounter (Signed)
Spoke to the patient and gave him the lab results from 09/29/16- patient stated an understanding and he agreed to keep his December appointment

## 2016-10-20 NOTE — Telephone Encounter (Signed)
Routing to you °

## 2016-10-20 NOTE — Telephone Encounter (Signed)
Patient was given lab results and he verbalized an understanding

## 2016-11-27 NOTE — Progress Notes (Deleted)
   Subjective:    Patient ID: Greg Johnson, male    DOB: 1965-09-09, 51 y.o.   MRN: [id number]  HPI  Last CPE by a colleague 14 mos prior.  This is my first time meeting this pt.   Primary Preventative Screenings: Prostate Cancer: psa nml but moderately high for 51 yo 2.5 yrs prior. STI screening: hiv, rpr neg 03/2014 Colorectal Cancer: cologuard neg 12/04/2015 Tobacco use/AAA/Lung/EtOH/Illicit substances:  Cardiac: baseline EKG 03/2014 done but results not in Epic??? 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 OTC/Vit/Supp/Herbal: Dentist/Optho: Immunizations:  Immunization History  Administered Date(s) Administered  . Hepatitis A 01/30/2005, 11/18/2006  . IPV 01/30/2005  . Influenza-Unspecified 12/25/2008, 12/03/2014  . MMR 02/02/2003  . Meningococcal Conjugate 01/30/2005  . Tdap 04/14/2014  . Typhoid Inactivated 10/25/2013  . Yellow Fever 10/25/2013     Chronic Medical Conditions: Thyroid nodules: Follows with endocrinology Dr. Dwyane Dee Lower ex distal neuropathy: saw podiatry prior without benefit. nml b12 last yr, nml thyroid 4 mos prior, neg rpr 03/2014 Failed gabapentin 300 qhs.  Pre-DM:  Lab Results  Component Value Date   HGBA1C 6.1 08/28/2015     Review of Systems     Objective:   Physical Exam        Assessment & Plan:  Flu shot

## 2016-11-28 ENCOUNTER — Ambulatory Visit (INDEPENDENT_AMBULATORY_CARE_PROVIDER_SITE_OTHER): Payer: Commercial Managed Care - PPO

## 2016-11-28 ENCOUNTER — Encounter: Payer: Self-pay | Admitting: Family Medicine

## 2016-11-28 ENCOUNTER — Ambulatory Visit (INDEPENDENT_AMBULATORY_CARE_PROVIDER_SITE_OTHER): Payer: Commercial Managed Care - PPO | Admitting: Family Medicine

## 2016-11-28 VITALS — BP 142/90 | HR 70 | Temp 98.3°F | Resp 16 | Ht 68.5 in | Wt 166.6 lb

## 2016-11-28 DIAGNOSIS — R030 Elevated blood-pressure reading, without diagnosis of hypertension: Secondary | ICD-10-CM

## 2016-11-28 DIAGNOSIS — M545 Low back pain: Secondary | ICD-10-CM

## 2016-11-28 DIAGNOSIS — Z113 Encounter for screening for infections with a predominantly sexual mode of transmission: Secondary | ICD-10-CM

## 2016-11-28 DIAGNOSIS — Z1389 Encounter for screening for other disorder: Secondary | ICD-10-CM | POA: Diagnosis not present

## 2016-11-28 DIAGNOSIS — G5793 Unspecified mononeuropathy of bilateral lower limbs: Secondary | ICD-10-CM | POA: Diagnosis not present

## 2016-11-28 DIAGNOSIS — Z1383 Encounter for screening for respiratory disorder NEC: Secondary | ICD-10-CM | POA: Diagnosis not present

## 2016-11-28 DIAGNOSIS — G8929 Other chronic pain: Secondary | ICD-10-CM | POA: Diagnosis not present

## 2016-11-28 DIAGNOSIS — Z125 Encounter for screening for malignant neoplasm of prostate: Secondary | ICD-10-CM | POA: Diagnosis not present

## 2016-11-28 DIAGNOSIS — Z23 Encounter for immunization: Secondary | ICD-10-CM | POA: Diagnosis not present

## 2016-11-28 DIAGNOSIS — Z136 Encounter for screening for cardiovascular disorders: Secondary | ICD-10-CM | POA: Diagnosis not present

## 2016-11-28 DIAGNOSIS — Z0000 Encounter for general adult medical examination without abnormal findings: Secondary | ICD-10-CM | POA: Diagnosis not present

## 2016-11-28 DIAGNOSIS — Z130 Encounter for screening for diseases of the blood and blood-forming organs and certain disorders involving the immune mechanism: Secondary | ICD-10-CM

## 2016-11-28 DIAGNOSIS — R7303 Prediabetes: Secondary | ICD-10-CM | POA: Diagnosis not present

## 2016-11-28 DIAGNOSIS — R21 Rash and other nonspecific skin eruption: Secondary | ICD-10-CM | POA: Diagnosis not present

## 2016-11-28 LAB — POCT URINALYSIS DIP (MANUAL ENTRY)
Bilirubin, UA: NEGATIVE
Glucose, UA: NEGATIVE mg/dL
Ketones, POC UA: NEGATIVE mg/dL
Leukocytes, UA: NEGATIVE
NITRITE UA: NEGATIVE
PH UA: 6.5 (ref 5.0–8.0)
Protein Ur, POC: NEGATIVE mg/dL
RBC UA: NEGATIVE
Spec Grav, UA: 1.02 (ref 1.010–1.025)
UROBILINOGEN UA: 0.2 U/dL

## 2016-11-28 LAB — POCT SKIN KOH: SKIN KOH, POC: NEGATIVE

## 2016-11-28 LAB — POCT GLYCOSYLATED HEMOGLOBIN (HGB A1C): HEMOGLOBIN A1C: 6.1

## 2016-11-28 LAB — POCT SEDIMENTATION RATE: POCT SED RATE: 5 mm/hr (ref 0–22)

## 2016-11-28 MED ORDER — AMITRIPTYLINE HCL 10 MG PO TABS: 10.0000 mg | ORAL_TABLET | Freq: Every day | ORAL | 1 refills | Status: DC

## 2016-11-28 NOTE — Progress Notes (Addendum)
Subjective:  By signing my name below, I, Greg Johnson, attest that this documentation has been prepared under the direction and in the presence of Delman Cheadle, MD. Electronically Signed: Moises Johnson, [place]. 11/28/2016 , 10:54 AM .  Patient was seen in Room 2 .   Patient ID: Greg Johnson, male    DOB: February 02, 1966, 51 y.o.   MRN: [id number] Chief Complaint  Patient presents with  . Annual Exam    burning feet    HPI  Last CPE by a colleague 14 mos prior.  This is my first time meeting this pt. He is fasting today.   Primary Preventative Screenings: Prostate Cancer: psa nml but moderately high for 51 yo 2.5 yrs prior. STI screening: hiv, rpr neg 03/2014 Colorectal Cancer: cologuard neg 12/04/2015 Tobacco use/AAA/Lung/EtOH/Illicit substances: He denies history of tobacco use or alcohol use.  Cardiac: baseline EKG 03/2014 done but results not in Epic; updated today.  Weight/Johnson sugar/Diet/Exercise: Doesn't exercise much due to working long hours at an [employer] and standing 8-10 hours per shift.  OTC/Vit/Supp/Herbal: no supplements.  Dentist/Optho: hasn't seen dentist or eye doctor in a while.  Immunizations:  Immunization History  Administered Date(s) Administered  . Hepatitis A 01/30/2005, 11/18/2006  . IPV 01/30/2005  . Influenza-Unspecified 12/25/2008, 12/03/2014  . MMR 02/02/2003  . Meningococcal Conjugate 01/30/2005  . Tdap 04/14/2014  . Typhoid Inactivated 10/25/2013  . Yellow Fever 10/25/2013   He usually receives flu shot at work, but missed his opportunity this year; will update today.   Chronic Medical Conditions: Thyroid nodules: Follows with endocrinology Dr. Dwyane Dee; denies any unexpected weight changes.   Lower ex distal neuropathy: saw podiatry prior without benefit. nml b12 last yr, nml thyroid 4 mos prior, neg rpr 03/2014 Failed gabapentin 332m qhs.   He reports burning pain on the bottom of his feet ongoing for 4 years now, describes "burning  sensation". He's talked to his PCP (Jonelle Sidle MHenderson Newcomer MD) regarding this once, and seen by podiatry. He notes podiatry did an xray and found nothing. He was prescribed gabapentin multiple times and was on it for over a year, but failed each time. He denied side effects with gabapentin. He's also tried meloxicam 115min the past without relief. He was seen by Dr. RePaulla Dollyn 2016. He notes his feet hurt all the time, worse after work with walking and standing. He mentions improvement when off his feet, as well as fasting that day. He denies seeing neurologist for this. He has occasional back pain. He denies weakness into his lower extremities.   Elevated BP: His BP was elevated during triage today. He denies checking his BP regularly.   Pre-DM:  Lab Results  Component Value Date   HGBA1C 6.1 08/28/2015   Past Medical History:  Diagnosis Date  . Allergy   . Anemia   . Anxiety   . Ulcer    Prior to Admission medications   Medication Sig Start Date End Date Taking? Authorizing Provider  clotrimazole-betamethasone (LOTRISONE) cream APPLY 1 APPLICATION TOPICALLY 2 (TWO) TIMES DAILY. 02/01/16  Yes HaScot JunFNP  fluticasone (FLONASE) 50 MCG/ACT nasal spray USE 1 SPRAY IN EACH NOSTRIL EVERY DAY 08/21/16  Yes [provider]  levocetirizine (XYZAL) 5 MG tablet Take 5 mg by mouth every evening.   Yes [provider]  meloxicam (MOBIC) 15 MG tablet Take 1 tablet (15 mg total) by mouth daily. 01/18/16  Yes HaScot JunFNP  Olopatadine HCl 0.2 % SOLN Apply  1 drop to eye daily. 08/21/16  Yes Yu, Amy V, PA-C  Polyethyl Glycol-Propyl Glycol (SYSTANE) 0.4-0.3 % SOLN Apply 1 drop to eye every 4 (four) hours. 08/21/16  Yes Yu, Amy V, PA-C  polyethylene glycol powder (GLYCOLAX/MIRALAX) powder Take 17 g by mouth 2 (two) times daily as needed. Increase dose until diarrhea, then back off a little until you have well formed stool. 05/19/16  Yes McVey, Gelene Mink, PA-C    Allergies  Allergen Reactions  . Aspirin Nausea And Vomiting   History reviewed. No pertinent surgical history. Social History   Social History  . Marital status: Married    Spouse name: N/A  . Number of children: N/A  . Years of education: N/A   Social History Main Topics  . Smoking status: Never Smoker  . Smokeless tobacco: Never Used  . Alcohol use No  . Drug use: Yes    Types: Other-see comments  . Sexual activity: Yes   Other Topics Concern  . None   Social History Narrative  . None   Family History  Problem Relation Age of Onset  . Diabetes Neg Hx    Depression screen [facility] 2/9 11/28/2016 06/03/2016 06/03/2016 05/19/2016 01/18/2016  Decreased Interest 0 0 0 0 0  Down, Depressed, Hopeless 0 0 0 0 0  PHQ - 2 Score 0 0 0 0 0     Review of Systems  Constitutional: Negative for fatigue and unexpected weight change.  Eyes: Negative for visual disturbance.  Respiratory: Negative for cough, chest tightness and shortness of breath.   Cardiovascular: Negative for chest pain, palpitations and leg swelling.  Gastrointestinal: Negative for abdominal pain and Johnson in stool.  Musculoskeletal: Positive for myalgias.  Neurological: Negative for dizziness, light-headedness and headaches.       Objective:   Physical Exam  Constitutional: He is oriented to person, place, and time. He appears well-developed and well-nourished. No distress.  HENT:  Head: Normocephalic and atraumatic.  Right Ear: Tympanic membrane is erythematous.  Mouth/Throat: Oropharynx is clear and moist.  Turbinates: pale boggy mucosa  Eyes: Pupils are equal, round, and reactive to light. EOM are normal.  Neck: Neck supple. No thyromegaly present.  Cardiovascular: Normal rate, regular rhythm, S1 normal, S2 normal and normal heart sounds.   No murmur heard. Pulses:      Dorsalis pedis pulses are 2+ on the right side, and 2+ on the left side.  Pulmonary/Chest: Effort normal and breath sounds normal. No  respiratory distress. He has no wheezes.  Abdominal: Soft. Bowel sounds are normal. He exhibits no distension. There is no hepatosplenomegaly. There is no tenderness. There is no CVA tenderness.  Genitourinary:  Genitourinary Comments: Prostate: left lobe enlarged laterally, greater than right  Musculoskeletal: Normal range of motion.  No point tenderness over lumbar spine, no paraspinal spasms; no point tenderness over SI joints or trochanter bursa; no lower extremity edema  Lymphadenopathy:    He has no cervical adenopathy.  Neurological: He is alert and oriented to person, place, and time.  Unable to illicit patellar and achilles DTR's  Skin: Skin is warm and dry.  Slight onychomycosis on left 5th toenail  Psychiatric: He has a normal mood and affect. His behavior is normal.  Nursing note and vitals reviewed.   BP (!) 142/90 (BP Location: Right Arm, Cuff Size: Normal)   Pulse 70   Temp 98.3 F (36.8 C) (Oral)   Resp 16   Ht 5' 8.5" (1.74 m)   Wt 166 lb  9.6 oz (75.6 kg)   SpO2 97%   BMI 24.96 kg/m    Dg Lumbar Spine 2-3 Views  Result Date: 11/28/2016 CLINICAL DATA:  Low back pain.  Burning sensation in the feet. EXAM: LUMBAR SPINE - 2-3 VIEW COMPARISON:  08/14/2010 FINDINGS: No fracture.  No bone lesion. Minor endplate spurring.  No other degenerative change. Soft tissues are unremarkable. IMPRESSION: 1. No fracture or acute finding. 2. Minor degenerative endplate spurring.  No other abnormalities. Electronically Signed   By: Lajean Manes M.D.   On: 11/28/2016 11:11   [facility] reading (PRIMARY) by  Dr. Brigitte Pulse. EKG: normal sinus rhythm, flipped P-waves in lead III, no other abnormalities seen. Prior EKG was done 2 yrs ago but no tracing available in chart for comparison.      Assessment & Plan:  Flu shot 1. Annual physical exam   2. Screening for cardiovascular, respiratory, and genitourinary diseases   3. Screening for deficiency anemia   4. Screening for prostate cancer   5.  Neuropathy of both feet - saw podiatry who rec neuro eval, failed mult rounds of gabapentin - gave a year trial. tsh nml 3 mos ago, a1c mildly elev but unchanged over sev years. b12 nml last 2 yrs. Check all other labs below but all have been nml prior. Pt reports primary care is here but seen by a different provider everytime and frustrated that his neuropathy is gone on unchanged. Explained importance of choosing single primary provider for continuity of care in order to have effective w/u and trx build upon past tests/trials - recheck in 1-2 mos to reassess. Start trial of TCA - explained that unlikely to have any positive effect initially but if he cont to take, then as we are gradually able to titrate up elavil dose, may have + impact on burning feet. Refer to neuro for EMG/NCV.  6. Prediabetes - unchanged over sev yrs - watch carb intake.  7. Elevated Johnson pressure reading - isolated elevation - rtc in 1-2 mos for recheck.  8. Need for prophylactic vaccination and inoculation against influenza   9. Chronic bilateral low back pain, with sciatica presence unspecified   10. Rash and nonspecific skin eruption   11. Screening for STD (sexually transmitted disease)      Orders Placed This Encounter  Procedures  . GC/Chlamydia Probe Amp  . DG Lumbar Spine 2-3 Views    Standing Status:   Future    Number of Occurrences:   1    Standing Expiration Date:   11/28/2017    Order Specific Question:   Reason for Exam (SYMPTOM  OR DIAGNOSIS REQUIRED)    Answer:   low back pain, feet burning    Order Specific Question:   Preferred imaging location?    Answer:   External  . Flu Vaccine QUAD 36+ mos IM  . CBC with Differential/Platelet  . Comprehensive metabolic panel    Order Specific Question:   Has the patient fasted?    Answer:   Yes  . Lipid panel    Order Specific Question:   Has the patient fasted?    Answer:   Yes  . PSA  . HIV antibody  . HCV Ab w/Rflx to Verification  . RPR  .  C-reactive protein  . Interpretation:  . Ambulatory referral to Neurology    Referral Priority:   Routine    Referral Type:   Consultation    Referral Reason:   Specialty Services Required  Requested Specialty:   Neurology    Number of Visits Requested:   1  . POCT urinalysis dipstick  . POCT glycosylated hemoglobin (Hb A1C)  . POCT Skin KOH  . POCT SEDIMENTATION RATE  . EKG 12-Lead    Meds ordered this encounter  Medications  . fluticasone (FLONASE) 50 MCG/ACT nasal spray    Sig: USE 1 SPRAY IN EACH NOSTRIL EVERY DAY    Refill:  4  . amitriptyline (ELAVIL) 10 MG tablet    Sig: Take 1 tablet (10 mg total) by mouth at bedtime.    Dispense:  90 tablet    Refill:  1    I personally performed the services described in this documentation, which was scribed in my presence. The recorded information has been reviewed and considered, and addended by me as needed.   Delman Cheadle, M.D.  Primary Care at [facility] [address] [place], [place] [address] [phone] phone [phone] fax  12/01/16 9:01 AM

## 2016-11-28 NOTE — Patient Instructions (Addendum)
IF you received an x-ray today, you will receive an invoice from [facility]. Please contact [facility] at [phone] with questions or concerns regarding your invoice.   IF you received labwork today, you will receive an invoice from [facility]. Please contact [facility] at [phone] with questions or concerns regarding your invoice.   Our billing staff will not be able to assist you with questions regarding bills from these companies.  You will be contacted with the lab results as soon as they are available. The fastest way to get your results is to activate your My Chart account. Instructions are located on the last page of this paperwork. If you have not heard from [place] regarding the results in 2 weeks, please contact this office.     Peripheral Neuropathy Peripheral neuropathy is a type of nerve damage. It affects nerves that carry signals between the spinal cord and other parts of the body. These are called peripheral nerves. With peripheral neuropathy, one nerve or a group of nerves may be damaged. What are the causes? Many things can damage peripheral nerves. For some people with peripheral neuropathy, the cause is unknown. Some causes include:  Diabetes. This is the most common cause of peripheral neuropathy.  Injury to a nerve.  Pressure or stress on a nerve that lasts a long time.  Too little vitamin B. Alcoholism can lead to this.  Infections.  Autoimmune diseases, such as multiple sclerosis and systemic lupus erythematosus.  Inherited nerve diseases.  Some medicines, such as cancer drugs.  Toxic substances, such as lead and mercury.  Too little blood flowing to the legs.  Kidney disease.  Thyroid disease.  What are the signs or symptoms? Different people have different symptoms. The symptoms you have will depend on which of your nerves is damaged. Common symptoms include:  Loss of feeling (numbness) in the feet and hands.  Tingling in  the feet and hands.  Pain that burns.  Very sensitive skin.  Weakness.  Not being able to move a part of the body (paralysis).  Muscle twitching.  Clumsiness or poor coordination.  Loss of balance.  Not being able to control your bladder.  Feeling dizzy.  Sexual problems.  How is this diagnosed? Peripheral neuropathy is a symptom, not a disease. Finding the cause of peripheral neuropathy can be hard. To figure that out, your health care provider will take a medical history and do a physical exam. A neurological exam will also be done. This involves checking things affected by your brain, spinal cord, and nerves (nervous system). For example, your health care provider will check your reflexes, how you move, and what you can feel. Other types of tests may also be ordered, such as:  Blood tests.  A test of the fluid in your spinal cord.  Imaging tests, such as CT scans or an MRI.  Electromyography (EMG). This test checks the nerves that control muscles.  Nerve conduction velocity tests. These tests check how fast messages pass through your nerves.  Nerve biopsy. A small piece of nerve is removed. It is then checked under a microscope.  How is this treated?  Medicine is often used to treat peripheral neuropathy. Medicines may include: ? Pain-relieving medicines. Prescription or over-the-counter medicine may be suggested. ? Antiseizure medicine. This may be used for pain. ? Antidepressants. These also may help ease pain from neuropathy. ? Lidocaine. This is a numbing medicine. You might wear a patch or be given a shot. ? Mexiletine.  This medicine is typically used to help control irregular heart rhythms.  Surgery. Surgery may be needed to relieve pressure on a nerve or to destroy a nerve that is causing pain.  Physical therapy to help movement.  Assistive devices to help movement. Follow these instructions at home:  Only take over-the-counter or prescription medicines  as directed by your health care provider. Follow the instructions carefully for any given medicines. Do not take any other medicines without first getting approval from your health care provider.  If you have diabetes, work closely with your health care provider to keep your blood sugar under control.  If you have numbness in your feet: ? Check every day for signs of injury or infection. Watch for redness, warmth, and swelling. ? Wear padded socks and comfortable shoes. These help protect your feet.  Do not do things that put pressure on your damaged nerve.  Do not smoke. Smoking keeps blood from getting to damaged nerves.  Avoid or limit alcohol. Too much alcohol can cause a lack of B vitamins. These vitamins are needed for healthy nerves.  Develop a good support system. Coping with peripheral neuropathy can be stressful. Talk to a mental health specialist or join a support group if you are struggling.  Follow up with your health care provider as directed. Contact a health care provider if:  You have new signs or symptoms of peripheral neuropathy.  You are struggling emotionally from dealing with peripheral neuropathy.  You have a fever. Get help right away if:  You have an injury or infection that is not healing.  You feel very dizzy or begin vomiting.  You have chest pain.  You have trouble breathing. This information is not intended to replace advice given to you by your health care provider. Make sure you discuss any questions you have with your health care provider. Document Released: 01/30/2002 Document Revised: 07/18/2015 Document Reviewed: 10/17/2012 Elsevier Interactive Patient Education  2017 Elsevier Inc. Prediabetes Prediabetes is the condition of having a blood sugar (blood glucose) level that is higher than it should be, but not high enough for you to be diagnosed with type 2 diabetes. Having prediabetes puts you at risk for developing type 2 diabetes (type 2  diabetes mellitus). Prediabetes may be called impaired glucose tolerance or impaired fasting glucose. Prediabetes usually does not cause symptoms. Your health care provider can diagnose this condition with blood tests. You may be tested for prediabetes if you are overweight and if you have at least one other risk factor for prediabetes. Risk factors for prediabetes include:  Having a family member with type 2 diabetes.  Being overweight or obese.  Being older than age 50.  Being of American-Indian, African-American, Hispanic/Latino, or Asian/Pacific Islander descent.  Having an inactive (sedentary) lifestyle.  Having a history of gestational diabetes or polycystic ovarian syndrome (PCOS).  Having low levels of good cholesterol (HDL-C) or high levels of blood fats (triglycerides).  Having high blood pressure.  What is blood glucose and how is blood glucose measured?  Blood glucose refers to the amount of glucose in your bloodstream. Glucose comes from eating foods that contain sugars and starches (carbohydrates) that the body breaks down into glucose. Your blood glucose level may be measured in mg/dL (milligrams per deciliter) or mmol/L (millimoles per liter).Your blood glucose may be checked with one or more of the following blood tests:  A fasting blood glucose (FBG) test. You will not be allowed to eat (you will fast) for at  least 8 hours before a blood sample is taken. ? A normal range for FBG is 70-100 mg/dl (8.4-6.9 mmol/L).  An A1c (hemoglobin A1c) blood test. This test provides information about blood glucose control over the previous 2?3months.  An oral glucose tolerance test (OGTT). This test measures your blood glucose twice: ? After fasting. This is your baseline level. ? Two hours after you drink a beverage that contains glucose.  You may be diagnosed with prediabetes:  If your FBG is 100?125 mg/dL (6.2-9.5 mmol/L).  If your A1c level is 5.7?6.4%.  If your OGGT  result is 140?199 mg/dL (2.8-41 mmol/L).  These blood tests may be repeated to confirm your diagnosis. What happens if blood glucose is too high? The pancreas produces a hormone (insulin) that helps move glucose from the bloodstream into cells. When cells in the body do not respond properly to insulin that the body makes (insulin resistance), excess glucose builds up in the blood instead of going into cells. As a result, high blood glucose (hyperglycemia) can develop, which can cause many complications. This is a symptom of prediabetes. What can happen if blood glucose stays higher than normal for a long time? Having high blood glucose for a long time is dangerous. Too much glucose in your blood can damage your nerves and blood vessels. Long-term damage can lead to complications from diabetes, which may include:  Heart disease.  Stroke.  Blindness.  Kidney disease.  Depression.  Poor circulation in the feet and legs, which could lead to surgical removal (amputation) in severe cases.  How can prediabetes be prevented from turning into type 2 diabetes?  To help prevent type 2 diabetes, take the following actions:  Be physically active. ? Do moderate-intensity physical activity for at least 30 minutes on at least 5 days of the week, or as much as told by your health care provider. This could be brisk walking, biking, or water aerobics. ? Ask your health care provider what activities are safe for you. A mix of physical activities may be best, such as walking, swimming, cycling, and strength training.  Lose weight as told by your health care provider. ? Losing 5-7% of your body weight can reverse insulin resistance. ? Your health care provider can determine how much weight loss is best for you and can help you lose weight safely.  Follow a healthy meal plan. This includes eating lean proteins, complex carbohydrates, fresh fruits and vegetables, low-fat dairy products, and healthy  fats. ? Follow instructions from your health care provider about eating or drinking restrictions. ? Make an appointment to see a diet and nutrition specialist (registered dietitian) to help you create a healthy eating plan that is right for you.  Do not smoke or use any tobacco products, such as cigarettes, chewing tobacco, and e-cigarettes. If you need help quitting, ask your health care provider.  Take over-the-counter and prescription medicines as told by your health care provider. You may be prescribed medicines that help lower the risk of type 2 diabetes.  This information is not intended to replace advice given to you by your health care provider. Make sure you discuss any questions you have with your health care provider. Document Released: 06/03/2015 Document Revised: 07/18/2015 Document Reviewed: 04/02/2015 Elsevier Interactive Patient Education  2018 [employer].  Prediabetes Eating Plan Prediabetes-also called impaired glucose tolerance or impaired fasting glucose-is a condition that causes blood sugar (blood glucose) levels to be higher than normal. Following a healthy diet can help to keep  prediabetes under control. It can also help to lower the risk of type 2 diabetes and heart disease, which are increased in people who have prediabetes. Along with regular exercise, a healthy diet:  Promotes weight loss.  Helps to control blood sugar levels.  Helps to improve the way that the body uses insulin.  What do I need to know about this eating plan?  Use the glycemic index (GI) to plan your meals. The index tells you how quickly a food will raise your blood sugar. Choose low-GI foods. These foods take a longer time to raise blood sugar.  Pay close attention to the amount of carbohydrates in the food that you eat. Carbohydrates increase blood sugar levels.  Keep track of how many calories you take in. Eating the right amount of calories will help you to achieve a healthy weight.  Losing about 7 percent of your starting weight can help to prevent type 2 diabetes.  You may want to follow a Mediterranean diet. This diet includes a lot of vegetables, lean meats or fish, whole grains, fruits, and healthy oils and fats. What foods can I eat? Grains Whole grains, such as whole-wheat or whole-grain breads, crackers, cereals, and pasta. Unsweetened oatmeal. Bulgur. Barley. Quinoa. Brown rice. Corn or whole-wheat flour tortillas or taco shells. Vegetables Lettuce. Spinach. Peas. Beets. Cauliflower. Cabbage. Broccoli. Carrots. Tomatoes. Squash. Eggplant. Herbs. Peppers. Onions. Cucumbers. Brussels sprouts. Fruits Berries. Bananas. Apples. Oranges. Grapes. Papaya. Mango. Pomegranate. Kiwi. Grapefruit. Cherries. Meats and Other Protein Sources Seafood. Lean meats, such as chicken and [place] or lean cuts of pork and beef. Tofu. Eggs. Nuts. Beans. Dairy Low-fat or fat-free dairy products, such as yogurt, cottage cheese, and cheese. Beverages Water. Tea. Coffee. Sugar-free or diet soda. Seltzer water. Milk. Milk alternatives, such as soy or almond milk. Condiments Mustard. Relish. Low-fat, low-sugar ketchup. Low-fat, low-sugar barbecue sauce. Low-fat or fat-free mayonnaise. Sweets and Desserts Sugar-free or low-fat pudding. Sugar-free or low-fat ice cream and other frozen treats. Fats and Oils Avocado. Walnuts. Olive oil. The items listed above may not be a complete list of recommended foods or beverages. Contact your dietitian for more options. What foods are not recommended? Grains Refined white flour and flour products, such as bread, pasta, snack foods, and cereals. Beverages Sweetened drinks, such as sweet iced tea and soda. Sweets and Desserts Baked goods, such as cake, cupcakes, pastries, cookies, and cheesecake. The items listed above may not be a complete list of foods and beverages to avoid. Contact your dietitian for more information. This information is not  intended to replace advice given to you by your health care provider. Make sure you discuss any questions you have with your health care provider. Document Released: 06/26/2014 Document Revised: 07/18/2015 Document Reviewed: 03/07/2014 Elsevier Interactive Patient Education  2017 Elsevier Inc.   Preventing Type 2 Diabetes Mellitus Type 2 diabetes (type 2 diabetes mellitus) is a long-term (chronic) disease that affects blood sugar (glucose) levels. Normally, a hormone called insulin allows glucose to enter cells in the body. The cells use glucose for energy. In type 2 diabetes, one or both of these problems may be present:  The body does not make enough insulin.  The body does not respond properly to insulin that it makes (insulin resistance).  Insulin resistance or lack of insulin causes excess glucose to build up in the blood instead of going into cells. As a result, high blood glucose (hyperglycemia) develops, which can cause many complications. Being overweight or obese and having an inactive (  sedentary) lifestyle can increase your risk for diabetes. Type 2 diabetes can be delayed or prevented by making certain nutrition and lifestyle changes. What nutrition changes can be made?  Eat healthy meals and snacks regularly. Keep a healthy snack with you for when you get hungry between meals, such as fruit or a handful of nuts.  Eat lean meats and proteins that are low in saturated fats, such as chicken, fish, egg whites, and beans. Avoid processed meats.  Eat plenty of fruits and vegetables and plenty of grains that have not been processed (whole grains). It is recommended that you eat: ? 1?2 cups of fruit every day. ? 2?3 cups of vegetables every day. ? 6?8 oz of whole grains every day, such as oats, whole wheat, bulgur, brown rice, quinoa, and millet.  Eat low-fat dairy products, such as milk, yogurt, and cheese.  Eat foods that contain healthy fats, such as nuts, avocado, olive oil, and  canola oil.  Drink water throughout the day. Avoid drinks that contain added sugar, such as soda or sweet tea.  Follow instructions from your health care provider about specific eating or drinking restrictions.  Control how much food you eat at a time (portion size). ? Check food labels to find out the serving sizes of foods. ? Use a kitchen scale to weigh amounts of foods.  Saute or steam food instead of frying it. Cook with water or broth instead of oils or butter.  Limit your intake of: ? Salt (sodium). Have no more than 1 tsp (2,400 mg) of sodium a day. If you have heart disease or high blood pressure, have less than ? tsp (1,500 mg) of sodium a day. ? Saturated fat. This is fat that is solid at room temperature, such as butter or fat on meat. What lifestyle changes can be made?  Activity  Do moderate-intensity physical activity for at least 30 minutes on at least 5 days of the week, or as much as told by your health care provider.  Ask your health care provider what activities are safe for you. A mix of physical activities may be best, such as walking, swimming, cycling, and strength training.  Try to add physical activity into your day. For example: ? Park in spots that are farther away than usual, so that you walk more. For example, park in a far corner of the parking lot when you go to the office or the grocery store. ? Take a walk during your lunch break. ? Use stairs instead of elevators or escalators. Weight Loss  Lose weight as directed. Your health care provider can determine how much weight loss is best for you and can help you lose weight safely.  If you are overweight or obese, you may be instructed to lose at least 5?7 % of your body weight. Alcohol and Tobacco   Limit alcohol intake to no more than 1 drink a day for nonpregnant women and 2 drinks a day for men. One drink equals 12 oz of beer, 5 oz of wine, or 1 oz of hard liquor.  Do not use any tobacco  products, such as cigarettes, chewing tobacco, and e-cigarettes. If you need help quitting, ask your health care provider. Work With Your Health Care Provider  Have your blood glucose tested regularly, as told by your health care provider.  Discuss your risk factors and how you can reduce your risk for diabetes.  Get screening tests as told by your health care provider. You may  have screening tests regularly, especially if you have certain risk factors for type 2 diabetes.  Make an appointment with a diet and nutrition specialist (registered dietitian). A registered dietitian can help you make a healthy eating plan and can help you understand portion sizes and food labels. Why are these changes important?  It is possible to prevent or delay type 2 diabetes and related health problems by making lifestyle and nutrition changes.  It can be difficult to recognize signs of type 2 diabetes. The best way to avoid possible damage to your body is to take actions to prevent the disease before you develop symptoms. What can happen if changes are not made?  Your blood glucose levels may keep increasing. Having high blood glucose for a long time is dangerous. Too much glucose in your blood can damage your blood vessels, heart, kidneys, nerves, and eyes.  You may develop prediabetes or type 2 diabetes. Type 2 diabetes can lead to many chronic health problems and complications, such as: ? Heart disease. ? Stroke. ? Blindness. ? Kidney disease. ? Depression. ? Poor circulation in the feet and legs, which could lead to surgical removal (amputation) in severe cases. Where to find support:  Ask your health care provider to recommend a registered dietitian, diabetes educator, or weight loss program.  Look for local or online weight loss groups.  Join a gym, fitness club, or outdoor activity group, such as a walking club. Where to find more information: To learn more about diabetes and diabetes  prevention, visit:  American Diabetes Association (ADA): www.diabetes.[employer] of Diabetes and Digestive and Kidney Diseases: ToyArticles.ca  To learn more about healthy eating, visit:  The U.[place]. Department of Agriculture Architect), Choose My Plate: http://yates.biz/  Office of Disease Prevention and Health Promotion (ODPHP), Dietary Guidelines: ListingMagazine.si  Summary  You can reduce your risk for type 2 diabetes by increasing your physical activity, eating healthy foods, and losing weight as directed.  Talk with your health care provider about your risk for type 2 diabetes. Ask about any blood tests or screening tests that you need to have. This information is not intended to replace advice given to you by your health care provider. Make sure you discuss any questions you have with your health care provider. Document Released: 06/03/2015 Document Revised: 07/18/2015 Document Reviewed: 04/02/2015 Elsevier Interactive Patient Education  [employer].

## 2016-11-29 LAB — HCV AB W/RFLX TO VERIFICATION

## 2016-11-29 LAB — CBC WITH DIFFERENTIAL/PLATELET
BASOS ABS: 0 10*3/uL (ref 0.0–0.2)
Basos: 0 %
EOS (ABSOLUTE): 0.1 10*3/uL (ref 0.0–0.4)
EOS: 2 %
HEMATOCRIT: 41.5 % (ref 37.5–51.0)
HEMOGLOBIN: 14.2 g/dL (ref 13.0–17.7)
IMMATURE GRANS (ABS): 0 10*3/uL (ref 0.0–0.1)
Immature Granulocytes: 0 %
LYMPHS ABS: 1.6 10*3/uL (ref 0.7–3.1)
LYMPHS: 35 %
MCH: 30.3 pg (ref 26.6–33.0)
MCHC: 34.2 g/dL (ref 31.5–35.7)
MCV: 89 fL (ref 79–97)
MONOCYTES: 6 %
Monocytes Absolute: 0.3 10*3/uL (ref 0.1–0.9)
Neutrophils Absolute: 2.6 10*3/uL (ref 1.4–7.0)
Neutrophils: 57 %
Platelets: 297 10*3/uL (ref 150–379)
RBC: 4.69 x10E6/uL (ref 4.14–5.80)
RDW: 14.3 % (ref 12.3–15.4)
WBC: 4.5 10*3/uL (ref 3.4–10.8)

## 2016-11-29 LAB — COMPREHENSIVE METABOLIC PANEL
ALBUMIN: 4.6 g/dL (ref 3.5–5.5)
ALT: 7 IU/L (ref 0–44)
AST: 15 IU/L (ref 0–40)
Albumin/Globulin Ratio: 1.8 (ref 1.2–2.2)
Alkaline Phosphatase: 44 IU/L (ref 39–117)
BUN / CREAT RATIO: 15 (ref 9–20)
BUN: 15 mg/dL (ref 6–24)
Bilirubin Total: 0.6 mg/dL (ref 0.0–1.2)
CALCIUM: 9.2 mg/dL (ref 8.7–10.2)
CO2: 26 mmol/L (ref 20–29)
CREATININE: 0.99 mg/dL (ref 0.76–1.27)
Chloride: 104 mmol/L (ref 96–106)
GFR calc non Af Amer: 88 mL/min/{1.73_m2} (ref >59)
GFR, EST AFRICAN AMERICAN: 102 mL/min/{1.73_m2} (ref >59)
GLOBULIN, TOTAL: 2.5 g/dL (ref 1.5–4.5)
GLUCOSE: 98 mg/dL (ref 65–99)
Potassium: 4.3 mmol/L (ref 3.5–5.2)
SODIUM: 141 mmol/L (ref 134–144)
TOTAL PROTEIN: 7.1 g/dL (ref 6.0–8.5)

## 2016-11-29 LAB — HCV INTERPRETATION

## 2016-11-29 LAB — HIV ANTIBODY (ROUTINE TESTING W REFLEX): HIV SCREEN 4TH GENERATION: NONREACTIVE

## 2016-11-29 LAB — RPR: RPR Ser Ql: NONREACTIVE

## 2016-11-29 LAB — LIPID PANEL
CHOL/HDL RATIO: 3.2 ratio (ref 0.0–5.0)
CHOLESTEROL TOTAL: 150 mg/dL (ref 100–199)
HDL: 47 mg/dL (ref >39)
LDL CALC: 95 mg/dL (ref 0–99)
Triglycerides: 38 mg/dL (ref 0–149)
VLDL CHOLESTEROL CAL: 8 mg/dL (ref 5–40)

## 2016-11-29 LAB — C-REACTIVE PROTEIN: CRP: 0.5 mg/L (ref 0.0–4.9)

## 2016-11-29 LAB — PSA: PROSTATE SPECIFIC AG, SERUM: 2.8 ng/mL (ref 0.0–4.0)

## 2016-12-01 ENCOUNTER — Encounter: Payer: Self-pay | Admitting: Radiology

## 2016-12-01 LAB — GC/CHLAMYDIA PROBE AMP
Chlamydia trachomatis, NAA: NEGATIVE
Neisseria gonorrhoeae by PCR: NEGATIVE

## 2017-02-05 ENCOUNTER — Ambulatory Visit: Payer: Commercial Managed Care - PPO | Admitting: Endocrinology

## 2017-02-11 ENCOUNTER — Ambulatory Visit: Payer: Commercial Managed Care - PPO | Admitting: Neurology

## 2017-02-11 ENCOUNTER — Encounter: Payer: Self-pay | Admitting: Neurology

## 2017-02-11 VITALS — BP 170/80 | HR 68 | Ht 68.5 in | Wt 165.5 lb

## 2017-02-11 DIAGNOSIS — R202 Paresthesia of skin: Secondary | ICD-10-CM

## 2017-02-11 MED ORDER — AMITRIPTYLINE HCL 25 MG PO TABS: 25.0000 mg | ORAL_TABLET | Freq: Every day | ORAL | 3 refills | Status: DC

## 2017-02-11 NOTE — Patient Instructions (Signed)
   We will increase the amitriptyline dose to 25 mg at night.  We will get EMG and NCV evaluation to look at the nerve function of the legs.   Elavil (amitriptyline) is an antidepressant medication that has many uses that may include headache, whiplash injuries, or for peripheral neuropathy pain. Side effects may include drowsiness, dry mouth, blurred vision, or constipation. As with any antidepressant medication, worsening depression may occur. If you had any significant side effects, please call our office. The full effects of this medication may take 7-10 days after starting the drug, or going up on the dose.

## 2017-02-11 NOTE — Progress Notes (Signed)
Reason for visit: Burning feet  Referring physician: Dr. Dewitt HoesSagardia  Greg Johnson is a 51 y.o. male  History of present illness:  Greg Johnson is a 51 year old black male with a history of burning dysesthesias in the feet that have been present for about 3 years.  The burning has spread slightly up to the lower legs on both sides, and are currently equal right to left.  The patient does have some mild low back pain without any radiation down the legs.  The patient denies any weakness of the legs.  He has more discomfort when he is up on his feet working, he feels better on the weekends when he does not have to work.  At times, he may wake up at night and notice the burning, he claims that the burning feeling does not keep him awake at night.  He denies any balance issues, he has not had any falls.  He denies issues controlling the bowels of the bladder.  He denies any significant neck pain or pain down the arms, he denies any burning sensations in the hands.  He has been tried on gabapentin previously without much benefit taking 300 mg 3 times daily.  He currently is on 10 mg of amitriptyline at night without much benefit.  He comes to this office for further evaluation.  Past Medical History:  Diagnosis Date  . Allergy   . Anemia   . Anxiety   . Ulcer     History reviewed. No pertinent surgical history.  Family History  Problem Relation Age of Onset  . Diabetes Neg Hx     Social history:  reports that  has never smoked. he has never used smokeless tobacco. He reports that he uses drugs. Drug: Other-see comments. He reports that he does not drink alcohol.  Medications:  Prior to Admission medications   Medication Sig Start Date End Date Taking? Authorizing Provider  amitriptyline (ELAVIL) 10 MG tablet Take 1 tablet (10 mg total) by mouth at bedtime. 11/28/16  Yes Sherren MochaShaw, Eva N, MD  clotrimazole-betamethasone (LOTRISONE) cream APPLY 1 APPLICATION TOPICALLY 2 (TWO) TIMES DAILY.  02/01/16  Yes Bing NeighborsHarris, Kimberly S, FNP  fluticasone (FLONASE) 50 MCG/ACT nasal spray USE 1 SPRAY IN EACH NOSTRIL EVERY DAY 08/21/16  Yes [provider]  levocetirizine (XYZAL) 5 MG tablet Take 5 mg by mouth every evening.   Yes [provider]  Olopatadine HCl 0.2 % SOLN Apply 1 drop to eye daily. 08/21/16  Yes Yu, Amy V, PA-C  Polyethyl Glycol-Propyl Glycol (SYSTANE) 0.4-0.3 % SOLN Apply 1 drop to eye every 4 (four) hours. 08/21/16  Yes Yu, Amy V, PA-C  polyethylene glycol powder (GLYCOLAX/MIRALAX) powder Take 17 g by mouth 2 (two) times daily as needed. Increase dose until diarrhea, then back off a little until you have well formed stool. 05/19/16  Yes McVey, Madelaine BhatElizabeth Whitney, PA-C      Allergies  Allergen Reactions  . Aspirin Nausea And Vomiting    ROS:  Out of a complete 14 system review of symptoms, the patient complains only of the following symptoms, and all other reviewed systems are negative.  Burning in the feet  Blood pressure (!) 170/80, pulse 68, height 5' 8.5" (1.74 m), weight 165 lb 8 oz (75.1 kg).  Physical Exam  General: The patient is alert and cooperative at the time of the examination.  Eyes: Pupils are equal, round, and reactive to light. Discs are flat bilaterally.  Neck: The neck is supple,  no carotid bruits are noted.  Respiratory: The respiratory examination is clear.  Cardiovascular: The cardiovascular examination reveals a regular rate and rhythm, no obvious murmurs or rubs are noted.  Skin: Extremities are without significant edema.  Neurologic Exam  Mental status: The patient is alert and oriented x 3 at the time of the examination. The patient has apparent normal recent and remote memory, with an apparently normal attention span and concentration ability.  Cranial nerves: Facial symmetry is present. There is good sensation of the face to pinprick and soft touch bilaterally. The strength of the facial muscles and the muscles to head  turning and shoulder shrug are normal bilaterally. Speech is well enunciated, no aphasia or dysarthria is noted. Extraocular movements are full. Visual fields are full. The tongue is midline, and the patient has symmetric elevation of the soft palate. No obvious hearing deficits are noted.  Motor: The motor testing reveals 5 over 5 strength of all 4 extremities. Good symmetric motor tone is noted throughout.  Sensory: Sensory testing is intact to pinprick, soft touch, vibration sensation, and position sense on all 4 extremities. No evidence of extinction is noted.  Coordination: Cerebellar testing reveals good finger-nose-finger and heel-to-shin bilaterally.  Gait and station: Gait is normal. Tandem gait is normal. Romberg is negative. No drift is seen.  Reflexes: Deep tendon reflexes are symmetric, but are depressed bilaterally. Toes are downgoing bilaterally.   Assessment/Plan:  1.  Burning dysesthesias, bilateral feet  The patient may have a low-grade peripheral neuropathy, further workup is indicated.  The patient will undergo blood work today, he will have nerve conduction studies on both legs and EMG on one leg.  If these studies are completely normal, MRI of the low back may be done.  The amitriptyline dose will be increased to 25 mg at night.  He will follow-up in 4 months.  Greg Palau. Keith Roscoe Witts MD 02/11/2017 3:16 PM  [facility] [address] [place], [place] [id number]  Phone [phone] Fax [phone]

## 2017-02-15 LAB — MULTIPLE MYELOMA PANEL, SERUM
ALBUMIN SERPL ELPH-MCNC: 4.2 g/dL (ref 2.9–4.4)
Albumin/Glob SerPl: 1.6 (ref 0.7–1.7)
Alpha 1: 0.2 g/dL (ref 0.0–0.4)
Alpha2 Glob SerPl Elph-Mcnc: 0.7 g/dL (ref 0.4–1.0)
B-Globulin SerPl Elph-Mcnc: 0.9 g/dL (ref 0.7–1.3)
Gamma Glob SerPl Elph-Mcnc: 1 g/dL (ref 0.4–1.8)
Globulin, Total: 2.8 g/dL (ref 2.2–3.9)
IGA/IMMUNOGLOBULIN A, SERUM: 149 mg/dL (ref 90–386)
IGM (IMMUNOGLOBULIN M), SRM: 28 mg/dL (ref 20–172)
IgG (Immunoglobin G), Serum: 1188 mg/dL (ref 700–1600)
TOTAL PROTEIN: 7 g/dL (ref 6.0–8.5)

## 2017-02-15 LAB — HIV ANTIBODY (ROUTINE TESTING W REFLEX): HIV SCREEN 4TH GENERATION: NONREACTIVE

## 2017-02-15 LAB — VITAMIN B12: VITAMIN B 12: 617 pg/mL (ref 232–1245)

## 2017-02-15 LAB — RHEUMATOID FACTOR

## 2017-02-15 LAB — ANGIOTENSIN CONVERTING ENZYME: ANGIO CONVERT ENZYME: 43 U/L (ref 14–82)

## 2017-02-15 LAB — ANA W/REFLEX: ANA: NEGATIVE

## 2017-02-15 LAB — B. BURGDORFI ANTIBODIES: Lyme IgG/IgM Ab: <0.91 {ISR} (ref 0.00–0.90)

## 2017-02-15 LAB — SEDIMENTATION RATE: Sed Rate: 2 mm/hr (ref 0–30)

## 2017-02-17 ENCOUNTER — Telehealth: Payer: Self-pay | Admitting: *Deleted

## 2017-02-17 NOTE — Telephone Encounter (Signed)
LVM for pt about unremarkable labs per CW,MD note. Gave [facility] phone number if he has further questions or concerns.

## 2017-02-17 NOTE — Telephone Encounter (Signed)
-----   Message from York Spanielharles K Willis, MD sent at 02/17/2017  7:22 AM EST -----   The blood work results are unremarkable. Please call the patient.  ----- Message ----- From: Nell RangeInterface, [facility] Lab Results In Sent: 02/12/2017   7:42 AM To: York Spanielharles K Willis, MD

## 2017-03-19 ENCOUNTER — Ambulatory Visit: Payer: Commercial Managed Care - PPO | Admitting: Endocrinology

## 2017-03-19 DIAGNOSIS — Z0289 Encounter for other administrative examinations: Secondary | ICD-10-CM

## 2017-06-03 ENCOUNTER — Ambulatory Visit (INDEPENDENT_AMBULATORY_CARE_PROVIDER_SITE_OTHER): Payer: Self-pay | Admitting: Urgent Care

## 2017-06-03 ENCOUNTER — Encounter: Payer: Self-pay | Admitting: Urgent Care

## 2017-06-03 ENCOUNTER — Other Ambulatory Visit: Payer: Self-pay

## 2017-06-03 VITALS — BP 130/64 | HR 76 | Temp 98.2°F | Resp 16 | Ht 68.25 in | Wt 164.2 lb

## 2017-06-03 DIAGNOSIS — Z024 Encounter for examination for driving license: Secondary | ICD-10-CM

## 2017-06-03 DIAGNOSIS — Z9109 Other allergy status, other than to drugs and biological substances: Secondary | ICD-10-CM

## 2017-06-03 NOTE — Progress Notes (Signed)
    Airline pilotCommercial Driver Medical Examination   Greg Johnson is a 52 y.o. male who presents today for a DOT physical exam. The patient reports allergies, takes Flonase and Xyzal.  Patient also takes amitriptyline. Denies dizziness, chronic headache, blurred vision, chest pain, shortness of breath, heart racing, palpitations, nausea, vomiting, abdominal pain, hematuria, lower leg swelling. Denies smoking cigarettes or drinking alcohol.   The following portions of the patient's history were reviewed and updated as appropriate: allergies, current medications, past family history, past medical history, past social history and past surgical history.  Objective:   BP 130/64 (BP Location: Right Arm)   Pulse 76   Temp 98.2 F (36.8 C) (Oral)   Resp 16   Ht 5' 8.25" (1.734 m)   Wt 164 lb 3.2 oz (74.5 kg)   SpO2 98%   BMI 24.78 kg/m   Vision/hearing:  Visual Acuity Screening   Right eye Left eye Both eyes  Without correction: 20/25 20/40 20/20   With correction:     Comments: Color: 6/6  Periph: Right 85 degrees  Left 85 degrees  Hearing Screening Comments: Whisper: Right ear - 10 feet                 Left ear - 10 feet  Patient can recognize and distinguish among traffic control signals and devices showing standard red, green, and amber colors.  Corrective lenses required: No  Monocular Vision?: No  Hearing aid requirement: No  Physical Exam  Constitutional: He is oriented to person, place, and time. He appears well-developed and well-nourished.  HENT:  TM's intact bilaterally, no effusions or erythema. Nasal turbinates pink and moist, nasal passages patent. No sinus tenderness. Oropharynx clear, mucous membranes moist, dentition in good repair.  Eyes: Pupils are equal, round, and reactive to light. Conjunctivae and EOM are normal. Right eye exhibits no discharge. Left eye exhibits no discharge. No scleral icterus.  Neck: Normal range of motion. Neck supple.  Cardiovascular:  Normal rate, regular rhythm and intact distal pulses. Exam reveals no gallop and no friction rub.  No murmur heard. Pulmonary/Chest: No stridor. No respiratory distress. He has no wheezes. He has no rales.  Abdominal: Soft. Bowel sounds are normal. He exhibits no distension and no mass. There is no tenderness.  Musculoskeletal: Normal range of motion. He exhibits no edema or tenderness.  Lymphadenopathy:    He has no cervical adenopathy.  Neurological: He is alert and oriented to person, place, and time. He has normal reflexes. He displays normal reflexes. Coordination normal.  Skin: Skin is warm and dry. No rash noted. No erythema. No pallor.  Psychiatric: He has a normal mood and affect.    Labs: Comments: U/A:  protein-trace, blood-negative, spg-1.010, glucose-negative  Assessment:    Healthy male exam.  Meets standards in 4349 CFR 391.41;  qualifies for 2 year certificate.    Plan:   Medical examiners certificate completed and printed. Return as needed.  Greg BambergMario Plummer Matich, PA-C [facility] [phone] 06/03/2017  5:01 PM

## 2017-06-03 NOTE — Patient Instructions (Signed)
     IF you received an x-ray today, you will receive an invoice from [facility]. Please contact  Radiology at [phone] with questions or concerns regarding your invoice.   IF you received labwork today, you will receive an invoice from [facility]. Please contact [facility] at [phone] with questions or concerns regarding your invoice.   Our billing staff will not be able to assist you with questions regarding bills from these companies.  You will be contacted with the lab results as soon as they are available. The fastest way to get your results is to activate your My Chart account. Instructions are located on the last page of this paperwork. If you have not heard from us regarding the results in 2 weeks, please contact this office.     

## 2018-02-12 ENCOUNTER — Ambulatory Visit (HOSPITAL_COMMUNITY)
Admission: EM | Admit: 2018-02-12 | Discharge: 2018-02-12 | Disposition: A | Discharge status: Home / Self Care | Payer: Commercial Managed Care - PPO | Attending: Family Medicine | Admitting: Family Medicine

## 2018-02-12 ENCOUNTER — Other Ambulatory Visit: Payer: Self-pay

## 2018-02-12 ENCOUNTER — Encounter (HOSPITAL_COMMUNITY): Payer: Self-pay | Admitting: Emergency Medicine

## 2018-02-12 DIAGNOSIS — K0889 Other specified disorders of teeth and supporting structures: Secondary | ICD-10-CM | POA: Insufficient documentation

## 2018-02-12 DIAGNOSIS — K047 Periapical abscess without sinus: Secondary | ICD-10-CM | POA: Diagnosis not present

## 2018-02-12 MED ORDER — KETOROLAC TROMETHAMINE 60 MG/2ML IM SOLN
60.0000 mg | Freq: Once | INTRAMUSCULAR | Status: AC
  Administered 2018-02-12: 60 mg via INTRAMUSCULAR

## 2018-02-12 MED ORDER — KETOROLAC TROMETHAMINE 60 MG/2ML IM SOLN
INTRAMUSCULAR | Status: AC
  Filled 2018-02-12: qty 2

## 2018-02-12 MED ORDER — IBUPROFEN 800 MG PO TABS: 800.0000 mg | ORAL_TABLET | Freq: Three times a day (TID) | ORAL | 0 refills | Status: AC

## 2018-02-12 MED ORDER — AMOXICILLIN-POT CLAVULANATE 875-125 MG PO TABS: 1.0000 | ORAL_TABLET | Freq: Two times a day (BID) | ORAL | 0 refills | Status: AC

## 2018-02-12 NOTE — ED Triage Notes (Signed)
Headache for 3 days, patient thinks this is stemming from a tooth problem, says there is a whole in tooth

## 2018-02-12 NOTE — Discharge Instructions (Addendum)
Toradol shot given in office Augmentin prescribed for potential infection.   Ibuprofen 800mg  prescribed.  Take once to twice daily as needed for pain.  Take with food to avoid GI upset, and avoid taking with other antiinflammatories like advil.   If GI upset occurs, discontinue, and begin taking gel advil as needed for pain.   Recommend soft diet until evaluated by dentist Maintain oral hygiene care Follow up with dentist as soon as possible for further evaluation and treatment  Return or go to the ED if you have any new or worsening symptoms such as fever, chills, difficulty swallowing, painful swallowing, oral or neck swelling, nausea, vomiting, chest pain, SOB, etc...Marland Kitchen

## 2018-02-12 NOTE — ED Provider Notes (Signed)
[facility]   [id number] 02/12/18 Arrival Time: 1107  CC: DENTAL PAIN  SUBJECTIVE:  Greg Johnson is a 52 y.o. male who presents with dental pain x 2-3 days.  Denies a precipitating event or trauma.  Localizes pain to RT upper side.  Has tried OTC analgesics without relief.  Worse with chewing.  Does see a dentist regularly.  Reports hx of "hole in tooth." Complains of associated HA related to dental pain.  Denies fever, chills, dysphagia, odynophagia, oral or neck swelling, nausea, vomiting, chest pain, SOB.    ROS: As per HPI.  Past Medical History:  Diagnosis Date  . Allergy   . Anemia   . Anxiety   . Ulcer    History reviewed. No pertinent surgical history. Allergies  Allergen Reactions  . Aspirin Nausea And Vomiting   No current facility-administered medications on file prior to encounter.    No current outpatient medications on file prior to encounter.   Social History   Socioeconomic History  . Marital status: Married    Spouse name: Not on file  . Number of children: Not on file  . Years of education: Not on file  . Highest education level: Not on file  Occupational History  . Not on file  Social Needs  . Financial resource strain: Not on file  . Food insecurity:    Worry: Not on file    Inability: Not on file  . Transportation needs:    Medical: Not on file    Non-medical: Not on file  Tobacco Use  . Smoking status: Never Smoker  . Smokeless tobacco: Never Used  Substance and Sexual Activity  . Alcohol use: No    Alcohol/week: 0.0 standard drinks  . Drug use: Yes    Types: Other-see comments  . Sexual activity: Yes  Lifestyle  . Physical activity:    Days per week: Not on file    Minutes per session: Not on file  . Stress: Not on file  Relationships  . Social connections:    Talks on phone: Not on file    Gets together: Not on file    Attends religious service: Not on file    Active member of club or organization: Not on file    Attends meetings of clubs or organizations: Not on file    Relationship status: Not on file  . Intimate partner violence:    Fear of current or ex partner: Not on file    Emotionally abused: Not on file    Physically abused: Not on file    Forced sexual activity: Not on file  Other Topics Concern  . Not on file  Social History Narrative   Lives with son and friend   Right handed    Caffeine use: none   Family History  Problem Relation Age of Onset  . Diabetes Neg Hx     OBJECTIVE:  Vitals:   02/12/18 1230 02/12/18 1235  BP: (!) 154/87 (!) 167/104  Pulse: 76   Resp: 18   Temp: 97.8 F (36.6 C)   TempSrc: Oral   SpO2: 100%     General appearance: alert; no distress HENT: normocephalic; atraumatic; dentition: good; possible dental caries (tooth #1) over right upper gums without areas of fluctuance Neck: supple without LAD Lungs: normal respirations Skin: warm and dry Psychological: alert and cooperative; normal mood and affect  ASSESSMENT & PLAN:  1. Dental infection   2. Pain, dental     Meds ordered this  encounter  Medications  . ketorolac (TORADOL) injection 60 mg  . amoxicillin-clavulanate (AUGMENTIN) 875-125 MG tablet    Sig: Take 1 tablet by mouth every 12 (twelve) hours for 10 days.    Dispense:  20 tablet    Refill:  0    Order Specific Question:   Supervising Provider    Answer:   Eustace MooreNELSON, YVONNE SUE [[id number]]  . ibuprofen (ADVIL,MOTRIN) 800 MG tablet    Sig: Take 1 tablet (800 mg total) by mouth 3 (three) times daily.    Dispense:  21 tablet    Refill:  0    Order Specific Question:   Supervising Provider    Answer:   Eustace MooreELSON, YVONNE SUE [[id number]]    Toradol shot given in office Augmentin prescribed for potential infection.   Ibuprofen 800mg  prescribed.  Take once to twice daily as needed for pain.  Take with food to avoid GI upset, and avoid taking with other antiinflammatories like advil.   If GI upset occurs, discontinue, and begin taking gel  advil as needed for pain.   Recommend soft diet until evaluated by dentist Maintain oral hygiene care Follow up with dentist as soon as possible for further evaluation and treatment  Return or go to the ED if you have any new or worsening symptoms such as fever, chills, difficulty swallowing, painful swallowing, oral or neck swelling, nausea, vomiting, chest pain, SOB, etc...  Reviewed expectations re: course of current medical issues. Questions answered. Outlined signs and symptoms indicating need for more acute intervention. Patient verbalized understanding. After Visit Summary given.   Rennis HardingWurst, Allyson Tineo, PA-C 02/12/18 1312

## 2018-05-10 ENCOUNTER — Encounter

## 2018-05-10 ENCOUNTER — Encounter: Payer: Commercial Managed Care - PPO | Admitting: Emergency Medicine

## 2018-06-21 ENCOUNTER — Other Ambulatory Visit: Payer: Self-pay | Admitting: *Deleted

## 2018-06-21 DIAGNOSIS — E041 Nontoxic single thyroid nodule: Secondary | ICD-10-CM

## 2018-06-21 DIAGNOSIS — R030 Elevated blood-pressure reading, without diagnosis of hypertension: Secondary | ICD-10-CM

## 2018-06-21 DIAGNOSIS — R7303 Prediabetes: Secondary | ICD-10-CM

## 2018-06-27 ENCOUNTER — Encounter: Payer: Self-pay | Admitting: Emergency Medicine

## 2018-06-28 ENCOUNTER — Telehealth: Payer: Self-pay | Admitting: Emergency Medicine

## 2018-06-28 NOTE — Telephone Encounter (Signed)
06/28/2018 - PATIENT HAD A COMPLETE PHYSICAL SCHEDULED WITH DR. Irving Shows ON Monday 06/27/2018. HE WAS A NO SHOW. I TRIED TO CALL AND GET HIS PHYSICAL RESCHEDULED BUT HAD TO LEAVE A VOICE MAIL FOR HIM TO RETURN OUR CALL. MBC

## 2018-07-04 DIAGNOSIS — Z13220 Encounter for screening for lipoid disorders: Secondary | ICD-10-CM | POA: Diagnosis not present

## 2018-07-04 DIAGNOSIS — N401 Enlarged prostate with lower urinary tract symptoms: Secondary | ICD-10-CM | POA: Diagnosis not present

## 2018-07-04 DIAGNOSIS — R2241 Localized swelling, mass and lump, right lower limb: Secondary | ICD-10-CM | POA: Diagnosis not present

## 2018-07-04 DIAGNOSIS — Z125 Encounter for screening for malignant neoplasm of prostate: Secondary | ICD-10-CM | POA: Diagnosis not present

## 2018-07-04 DIAGNOSIS — I10 Essential (primary) hypertension: Secondary | ICD-10-CM | POA: Diagnosis not present

## 2018-07-06 DIAGNOSIS — I10 Essential (primary) hypertension: Secondary | ICD-10-CM | POA: Diagnosis not present

## 2018-07-06 DIAGNOSIS — N390 Urinary tract infection, site not specified: Secondary | ICD-10-CM | POA: Diagnosis not present

## 2018-07-19 ENCOUNTER — Encounter: Payer: Commercial Managed Care - PPO | Admitting: Emergency Medicine

## 2021-04-17 NOTE — Progress Notes (Unsigned)
Letter not mailed to patient.  ° °LOV with PCP was 2018 ° °

## 2023-08-06 ENCOUNTER — Ambulatory Visit: Admitting: Internal Medicine

## 2023-08-06 ENCOUNTER — Encounter: Payer: Self-pay | Admitting: Internal Medicine

## 2023-08-06 VITALS — BP 120/78 | HR 88 | Temp 98.4°F | Resp 18 | Ht 68.0 in | Wt 179.5 lb

## 2023-08-06 DIAGNOSIS — G629 Polyneuropathy, unspecified: Secondary | ICD-10-CM | POA: Insufficient documentation

## 2023-08-06 DIAGNOSIS — R7303 Prediabetes: Secondary | ICD-10-CM | POA: Diagnosis not present

## 2023-08-06 DIAGNOSIS — Z6827 Body mass index (BMI) 27.0-27.9, adult: Secondary | ICD-10-CM

## 2023-08-06 DIAGNOSIS — D170 Benign lipomatous neoplasm of skin and subcutaneous tissue of head, face and neck: Secondary | ICD-10-CM | POA: Insufficient documentation

## 2023-08-06 DIAGNOSIS — G5793 Unspecified mononeuropathy of bilateral lower limbs: Secondary | ICD-10-CM | POA: Diagnosis not present

## 2023-08-06 NOTE — Progress Notes (Signed)
   New Patient Office Visit  Subjective    Patient ID: Greg Johnson, male    DOB: [birth year]  Age: 58 y.o. MRN: [id number]  CC:  Chief Complaint  Patient presents with   New Patient (Initial Visit)    HPI Greg Johnson presents to establish care   Outpatient Encounter Medications as of 08/06/2023  Medication Sig   ibuprofen  (ADVIL ,MOTRIN ) 800 MG tablet Take 1 tablet (800 mg total) by mouth 3 (three) times daily.   No facility-administered encounter medications on file as of 08/06/2023.    Past Medical History:  Diagnosis Date   Allergy    Anemia    Anxiety    Ulcer     No past surgical history on file.  Family History  Problem Relation Age of Onset   Diabetes Neg Hx     Social History   Socioeconomic History   Marital status: Married    Spouse name: Not on file   Number of children: Not on file   Years of education: Not on file   Highest education level: Not on file  Occupational History   Not on file  Tobacco Use   Smoking status: Never   Smokeless tobacco: Never  Vaping Use   Vaping status: Never Used  Substance and Sexual Activity   Alcohol use: No    Alcohol/week: 0.0 standard drinks of alcohol   Drug use: Yes    Types: Other-see comments   Sexual activity: Yes  Other Topics Concern   Not on file  Social History Narrative   Lives with son and friend   Right handed    Caffeine use: none   Social Drivers of Corporate investment banker Strain: Not on file  Food Insecurity: Not on file  Transportation Needs: Not on file  Physical Activity: Not on file  Stress: Not on file  Social Connections: Not on file  Intimate Partner Violence: Not on file    ROS      Objective    BP 120/78 (BP Location: Left Arm, Patient Position: Sitting, Cuff Size: Normal)   Pulse 88   Temp 98.4 F (36.9 C)   Resp 18   Ht 5' 8 (1.727 m)   Wt 179 lb 8 oz (81.4 kg)   SpO2 97%   BMI 27.29 kg/m   Physical Exam      Assessment & Plan:   Problem  List Items Addressed This Visit   None   No follow-ups on file.   Tita Form, MD

## 2023-08-07 ENCOUNTER — Other Ambulatory Visit: Payer: Self-pay | Admitting: Internal Medicine

## 2023-08-07 LAB — CMP14 + ANION GAP
ALT: 14 IU/L (ref 0–44)
AST: 16 IU/L (ref 0–40)
Albumin: 4.3 g/dL (ref 3.8–4.9)
Alkaline Phosphatase: 53 IU/L (ref 44–121)
Anion Gap: 15 mmol/L (ref 10.0–18.0)
BUN/Creatinine Ratio: 13 (ref 9–20)
BUN: 16 mg/dL (ref 6–24)
Bilirubin Total: 0.5 mg/dL (ref 0.0–1.2)
CO2: 22 mmol/L (ref 20–29)
Calcium: 8.9 mg/dL (ref 8.7–10.2)
Chloride: 104 mmol/L (ref 96–106)
Creatinine, Ser: 1.25 mg/dL (ref 0.76–1.27)
Globulin, Total: 2.4 g/dL (ref 1.5–4.5)
Glucose: 127 mg/dL — ABNORMAL HIGH (ref 70–99)
Potassium: 4.6 mmol/L (ref 3.5–5.2)
Sodium: 141 mmol/L (ref 134–144)
Total Protein: 6.7 g/dL (ref 6.0–8.5)
eGFR: 67 mL/min/{1.73_m2} (ref >59)

## 2023-08-07 LAB — TSH: TSH: 1.36 u[IU]/mL (ref 0.450–4.500)

## 2023-08-07 LAB — HEMOGLOBIN A1C
Est. average glucose Bld gHb Est-mCnc: 131 mg/dL
Hgb A1c MFr Bld: 6.2 % — ABNORMAL HIGH (ref 4.8–5.6)

## 2023-08-07 LAB — VITAMIN B12: Vitamin B-12: 982 pg/mL (ref 232–1245)

## 2023-08-07 MED ORDER — PREGABALIN 75 MG PO CAPS: 75.0000 mg | ORAL_CAPSULE | Freq: Two times a day (BID) | ORAL | 2 refills | Status: DC

## 2023-08-07 NOTE — Assessment & Plan Note (Signed)
 He has lipoma versus cyst on his forehead and he wanted that to be removed.  I will refer him to see general surgeon

## 2023-08-07 NOTE — Assessment & Plan Note (Signed)
 I will do B12 and TSH level and also will do renal function evaluation.

## 2023-08-07 NOTE — Assessment & Plan Note (Signed)
 I will repeat hemoglobin A1c.  He will continue to watch his diet.

## 2023-09-07 ENCOUNTER — Encounter: Admitting: Internal Medicine

## 2023-09-17 ENCOUNTER — Encounter: Admitting: Internal Medicine

## 2023-09-21 ENCOUNTER — Encounter: Payer: Self-pay | Admitting: Internal Medicine

## 2023-09-21 ENCOUNTER — Ambulatory Visit: Admitting: Internal Medicine

## 2023-09-21 VITALS — BP 118/70 | HR 87 | Temp 98.0°F | Resp 18 | Ht 68.0 in | Wt 182.0 lb

## 2023-09-21 DIAGNOSIS — E041 Nontoxic single thyroid nodule: Secondary | ICD-10-CM | POA: Diagnosis not present

## 2023-09-21 DIAGNOSIS — Z0000 Encounter for general adult medical examination without abnormal findings: Secondary | ICD-10-CM | POA: Insufficient documentation

## 2023-09-21 DIAGNOSIS — D170 Benign lipomatous neoplasm of skin and subcutaneous tissue of head, face and neck: Secondary | ICD-10-CM

## 2023-09-21 DIAGNOSIS — Z125 Encounter for screening for malignant neoplasm of prostate: Secondary | ICD-10-CM | POA: Insufficient documentation

## 2023-09-21 DIAGNOSIS — R7303 Prediabetes: Secondary | ICD-10-CM

## 2023-09-21 DIAGNOSIS — Z6827 Body mass index (BMI) 27.0-27.9, adult: Secondary | ICD-10-CM | POA: Diagnosis not present

## 2023-09-21 DIAGNOSIS — G5793 Unspecified mononeuropathy of bilateral lower limbs: Secondary | ICD-10-CM

## 2023-09-21 MED ORDER — PREGABALIN 75 MG PO CAPS: 75.0000 mg | ORAL_CAPSULE | Freq: Two times a day (BID) | ORAL | 2 refills | Status: DC

## 2023-09-21 MED ORDER — TETANUS-DIPHTHERIA TOXOIDS TD 5-2 LFU IM INJ: 0.5000 mL | INJECTION | Freq: Once | INTRAMUSCULAR | 0 refills | Status: AC

## 2023-09-21 MED ORDER — HYDROCORTISONE 1 % EX CREA: 1.0000 | TOPICAL_CREAM | Freq: Two times a day (BID) | CUTANEOUS | 0 refills | Status: AC

## 2023-09-21 NOTE — Assessment & Plan Note (Signed)
 His Hb A1c was 6.2% last month. He will continue to watch his diet.

## 2023-09-21 NOTE — Assessment & Plan Note (Signed)
I will do PSA.

## 2023-09-21 NOTE — Assessment & Plan Note (Signed)
 He has appointment with general surgeon in [place] for removal of lipoma in October.

## 2023-09-21 NOTE — Progress Notes (Signed)
   Office Visit  Subjective   Patient ID: Greg Johnson   DOB: 1965-05-10   Age: 58 y.o.   MRN: [id number]   Chief Complaint Chief Complaint  Patient presents with   Annual Exam     History of Present Illness 58 years old male is here for annual physical examination. He is married, he never smoke, he does not drink. He work as Lobbyist.  He does not get flu vaccine. He never has pneumonia or shingle vaccine. His last tetanus shot was more than 10 years ago.   He denies any urine problem. He has stool test twice before for cancer screening.   He denies any cancer in his family. No diabetes and hypertension.    He has borderline diabetes. His Hb A1c was 6.2% last month. He also has thyroid  nodule and per patient he has twice biopsies done and that was normal.  He also has burning feeling in his feet and it is much better with pregabalin .   He is c/o hemorrhoids but denies any constipation.   Past Medical History Past Medical History:  Diagnosis Date   Allergy    Anemia    Anxiety    Ulcer      Allergies Allergies  Allergen Reactions   Aspirin Nausea And Vomiting     Review of Systems Review of Systems  Constitutional: Negative.   HENT: Negative.    Respiratory: Negative.    Cardiovascular: Negative.   Gastrointestinal: Negative.        Hemorrhoids  Neurological: Negative.        Objective:    Vitals BP 118/70   Pulse 87   Temp 98 F (36.7 C)   Resp 18   Ht 5' 8 (1.727 m)   Wt 182 lb (82.6 kg)   SpO2 97%   BMI 27.67 kg/m    Physical Examination Physical Exam Constitutional:      Appearance: Normal appearance.  Neck:     Comments: Thyroid  nodule Cardiovascular:     Rate and Rhythm: Normal rate and regular rhythm.     Heart sounds: Normal heart sounds.  Pulmonary:     Effort: Pulmonary effort is normal.     Breath sounds: Normal breath sounds.  Abdominal:     General: Bowel sounds are normal.     Palpations: Abdomen is soft.   Neurological:     General: No focal deficit present.     Mental Status: He is oriented to person, place, and time.        Assessment & Plan:   Thyroid  nodule Per patient, he has twice needle biopsies and that was negative. His TSH was normal.  Neuropathy of both feet He takes pregabalin  and that is helping with burning feeling in his feet.  Screening for prostate cancer I will do PSA  Routine physical examination I will schedule him for screening colonoscopy after he come back from oversee trip.  Lipoma of forehead He has appointment with general surgeon in [place] for removal of lipoma in October.  Borderline diabetes mellitus His Hb A1c was 6.2% last month. He will continue to watch his diet.    Return in about 3 months (around 12/22/2023).   Roetta Dare, MD

## 2023-09-21 NOTE — Assessment & Plan Note (Signed)
 I will schedule him for screening colonoscopy after he come back from oversee trip.

## 2023-09-21 NOTE — Assessment & Plan Note (Signed)
 He takes pregabalin  and that is helping with burning feeling in his feet.

## 2023-09-21 NOTE — Assessment & Plan Note (Signed)
 Per patient, he has twice needle biopsies and that was negative. His TSH was normal.

## 2023-09-22 ENCOUNTER — Ambulatory Visit: Payer: Self-pay

## 2023-09-22 ENCOUNTER — Other Ambulatory Visit: Payer: Self-pay | Admitting: Internal Medicine

## 2023-09-22 LAB — CBC WITH DIFFERENTIAL/PLATELET
Basophils Absolute: 0.1 x10E3/uL (ref 0.0–0.2)
Basos: 1 %
EOS (ABSOLUTE): 0.2 x10E3/uL (ref 0.0–0.4)
Eos: 4 %
Hematocrit: 44.4 % (ref 37.5–51.0)
Hemoglobin: 13.8 g/dL (ref 13.0–17.7)
Immature Grans (Abs): 0 x10E3/uL (ref 0.0–0.1)
Immature Granulocytes: 0 %
Lymphocytes Absolute: 1.7 x10E3/uL (ref 0.7–3.1)
Lymphs: 33 %
MCH: 29.4 pg (ref 26.6–33.0)
MCHC: 31.1 g/dL — ABNORMAL LOW (ref 31.5–35.7)
MCV: 95 fL (ref 79–97)
Monocytes Absolute: 0.4 x10E3/uL (ref 0.1–0.9)
Monocytes: 7 %
Neutrophils Absolute: 3 x10E3/uL (ref 1.4–7.0)
Neutrophils: 55 %
Platelets: 277 x10E3/uL (ref 150–450)
RBC: 4.69 x10E6/uL (ref 4.14–5.80)
RDW: 13.7 % (ref 11.6–15.4)
WBC: 5.4 x10E3/uL (ref 3.4–10.8)

## 2023-09-22 LAB — LIPID PANEL
Chol/HDL Ratio: 3.5 ratio (ref 0.0–5.0)
Cholesterol, Total: 139 mg/dL (ref 100–199)
HDL: 40 mg/dL (ref >39)
LDL Chol Calc (NIH): 83 mg/dL (ref 0–99)
Triglycerides: 83 mg/dL (ref 0–149)
VLDL Cholesterol Cal: 16 mg/dL (ref 5–40)

## 2023-09-22 LAB — VITAMIN D 25 HYDROXY (VIT D DEFICIENCY, FRACTURES): Vit D, 25-Hydroxy: 17.6 ng/mL — ABNORMAL LOW (ref 30.0–100.0)

## 2023-09-22 LAB — PSA: Prostate Specific Ag, Serum: 3.2 ng/mL (ref 0.0–4.0)

## 2023-09-22 MED ORDER — VITAMIN D3 50 MCG (2000 UT) PO CAPS: 2000.0000 [IU] | ORAL_CAPSULE | Freq: Every day | ORAL | 11 refills | Status: AC

## 2023-09-22 NOTE — Progress Notes (Signed)
 Patient called.  Patient aware. His vitamin D  is low and he need to take one vitamin D  5000 daily for 3 months then once a week after 3 months.

## 2023-11-24 ENCOUNTER — Other Ambulatory Visit: Payer: Self-pay | Admitting: Internal Medicine

## 2023-11-25 ENCOUNTER — Other Ambulatory Visit: Payer: Self-pay | Admitting: Internal Medicine

## 2023-11-25 MED ORDER — NYSTATIN 100000 UNIT/GM EX CREA: 1.0000 | TOPICAL_CREAM | Freq: Two times a day (BID) | CUTANEOUS | 0 refills | Status: AC

## 2023-11-30 ENCOUNTER — Ambulatory Visit: Admitting: Dermatology

## 2023-12-21 ENCOUNTER — Encounter: Payer: Self-pay | Admitting: Internal Medicine

## 2023-12-21 ENCOUNTER — Ambulatory Visit: Admitting: Internal Medicine

## 2023-12-21 VITALS — BP 122/80 | HR 80 | Temp 97.9°F | Resp 18 | Ht 68.0 in | Wt 184.1 lb

## 2023-12-21 DIAGNOSIS — Z1211 Encounter for screening for malignant neoplasm of colon: Secondary | ICD-10-CM | POA: Insufficient documentation

## 2023-12-21 DIAGNOSIS — N529 Male erectile dysfunction, unspecified: Secondary | ICD-10-CM | POA: Insufficient documentation

## 2023-12-21 DIAGNOSIS — L509 Urticaria, unspecified: Secondary | ICD-10-CM | POA: Diagnosis not present

## 2023-12-21 MED ORDER — PREGABALIN 75 MG PO CAPS: 75.0000 mg | ORAL_CAPSULE | Freq: Two times a day (BID) | ORAL | 2 refills | Status: AC

## 2023-12-21 MED ORDER — FEXOFENADINE HCL 180 MG PO TABS: 180.0000 mg | ORAL_TABLET | Freq: Every day | ORAL | 3 refills | Status: AC

## 2023-12-21 NOTE — Progress Notes (Signed)
   Office Visit  Subjective   Patient ID: Greg Johnson   DOB: Jul 03, 1965   Age: 58 y.o.   MRN: [id number]   Chief Complaint Chief Complaint  Patient presents with   Nephropathy    Of both feet     History of Present Illness  58 years old male who is here for follow-up.  He says that he ran out of pregabalin  [place] and he started having burning pain in his feet again.  His B12 and TSH levels were normal.  He wanted refill of pregabalin .  He is complaining of itching and rash on his face since yesterday.  He is not sure what he ate and he never have any allergies before.    He also says that he went back home and he notice he does not get any erection any more when he met his wife.    He also has annual physical earlier this year where his vitamin-D level was low and he says that he took vitamin-D tablet for only 2 months and stopped.  I have reviewed his labs with him.  He wanted colonoscopy so I will refer him for screening colonoscopy.  Past Medical History Past Medical History:  Diagnosis Date   Allergy    Anemia    Anxiety    Ulcer      Allergies Allergies  Allergen Reactions   Aspirin Nausea And Vomiting     Review of Systems Review of Systems  Respiratory: Negative.    Cardiovascular: Negative.   Skin:  Positive for itching and rash.  Neurological:  Positive for tingling.       Objective:    Vitals BP 122/80 (BP Location: Left Arm, Patient Position: Sitting)   Pulse 80   Temp 97.9 F (36.6 C)   Resp 18   Ht 5' 8 (1.727 m)   Wt 184 lb 2 oz (83.5 kg)   SpO2 98%   BMI 28.00 kg/m    Physical Examination Physical Exam Constitutional:      Appearance: Normal appearance.  Cardiovascular:     Rate and Rhythm: Normal rate and regular rhythm.     Heart sounds: Normal heart sounds.  Pulmonary:     Effort: Pulmonary effort is normal.     Breath sounds: Normal breath sounds.  Abdominal:     General: Bowel sounds are normal.      Palpations: Abdomen is soft.  Neurological:     General: No focal deficit present.     Mental Status: He is alert and oriented to person, place, and time.        Assessment & Plan:   Urticaria   He will take fexofenadine 180 mg daily for 1 week.  If itching and rash is not better then he will call  Screening for colon cancer  I will schedule him for screening colonoscopy with gastroenterologist in [place].  Erectile dysfunction   I will do testosterone level.    Return in about 3 months (around 03/22/2024).   Roetta Dare, MD

## 2023-12-21 NOTE — Assessment & Plan Note (Signed)
I will do testosterone level

## 2023-12-21 NOTE — Assessment & Plan Note (Signed)
 He will take fexofenadine 180 mg daily for 1 week.  If itching and rash is not better then he will call

## 2023-12-21 NOTE — Assessment & Plan Note (Signed)
 I will schedule him for screening colonoscopy with gastroenterologist in [place].

## 2023-12-22 ENCOUNTER — Ambulatory Visit: Payer: Self-pay

## 2023-12-22 LAB — TESTOSTERONE: Testosterone: 521 ng/dL (ref 264–916)

## 2023-12-22 NOTE — Progress Notes (Signed)
Patient called.  Left message for patient to call back.

## 2023-12-24 NOTE — Progress Notes (Signed)
 Patient called.  Patient aware.   Dr caleen: His testosterone level is normal.

## 2024-03-13 ENCOUNTER — Encounter: Payer: Self-pay | Admitting: Gastroenterology

## 2024-03-28 ENCOUNTER — Telehealth: Payer: Self-pay | Admitting: *Deleted

## 2024-03-28 ENCOUNTER — Ambulatory Visit: Admitting: *Deleted

## 2024-03-28 VITALS — Ht 68.0 in | Wt 179.0 lb

## 2024-03-28 DIAGNOSIS — Z1211 Encounter for screening for malignant neoplasm of colon: Secondary | ICD-10-CM

## 2024-03-28 NOTE — Telephone Encounter (Signed)
 Pt was in pt visit call made to see if PV could be doe over [place] LM with call back #

## 2024-03-28 NOTE — Progress Notes (Signed)
 Pt's name and DOB verified at the beginning of the pre-visit with 2 identifiers   Pt denies any difficulty with ambulating,sitting, laying down or rolling side to side  Pt has no issues moving head neck or swallowing  No egg or soy allergy known to patient   No sedation history  No FH of Malignant Hyperthermia  Pt is not on home 02   Pt is not on blood thinners   Pt denies issues with constipation   Pt is not on dialysis  Pt denise any abnormal heart rhythms   Pt denies any upcoming cardiac testing  Patient's chart reviewed by Norleen Schillings CNRA prior to pre-visit and patient appropriate for the [facility].  Pre-visit completed and red dot placed by patient's name on their procedure day (on provider's schedule).     Visit in person  Pt states weight is 179 lb  Pt given  both [facility] main # and MD on call # prior to instructions.  Informed pt to come in at the time discussed and is shown on PV instructions.  Pt instructed to use [employer] or GoodRx for a price reduction on prep  Instructions given to pt. Instructed pt on all aspects of written instructions including med holds clothing to wear and foods to eat and not eat as well as after procedure legal restrictions and to call MD on call if needed.. Pt states understanding. Instructed pt to review instructions again prior to procedure and call main # given if has any questions or any issues. Pt states they will.

## 2024-04-11 ENCOUNTER — Encounter: Admitting: Gastroenterology

## 2024-06-28 ENCOUNTER — Ambulatory Visit: Admitting: Physician Assistant
